# Patient Record
Sex: Female | Born: 1950 | Race: White | Hispanic: No | Smoking: Former smoker
Health system: Southern US, Community
[De-identification: ages and names within clinical notes are randomized; demographics above are authoritative.]

## PROBLEM LIST (undated history)

## (undated) ENCOUNTER — Emergency Department (HOSPITAL_BASED_OUTPATIENT_CLINIC_OR_DEPARTMENT_OTHER): Admission: EM | Discharge status: Absent without leave | Payer: BLUE CROSS/BLUE SHIELD

## (undated) DIAGNOSIS — T7840XA Allergy, unspecified, initial encounter: Secondary | ICD-10-CM

## (undated) DIAGNOSIS — M8580 Other specified disorders of bone density and structure, unspecified site: Secondary | ICD-10-CM

## (undated) DIAGNOSIS — E079 Disorder of thyroid, unspecified: Secondary | ICD-10-CM

## (undated) DIAGNOSIS — D3501 Benign neoplasm of right adrenal gland: Secondary | ICD-10-CM

## (undated) DIAGNOSIS — C801 Malignant (primary) neoplasm, unspecified: Secondary | ICD-10-CM

## (undated) DIAGNOSIS — L649 Androgenic alopecia, unspecified: Secondary | ICD-10-CM

## (undated) DIAGNOSIS — J45909 Unspecified asthma, uncomplicated: Secondary | ICD-10-CM

## (undated) HISTORY — PX: TONSILLECTOMY: SUR1361

## (undated) HISTORY — PX: WISDOM TOOTH EXTRACTION: SHX21

## (undated) HISTORY — DX: Malignant (primary) neoplasm, unspecified: C80.1

## (undated) HISTORY — PX: ABDOMINAL HYSTERECTOMY: SHX81

## (undated) HISTORY — PX: RHINOPLASTY: SUR1284

## (undated) HISTORY — DX: Benign neoplasm of right adrenal gland: D35.01

## (undated) HISTORY — DX: Unspecified asthma, uncomplicated: J45.909

## (undated) HISTORY — DX: Androgenic alopecia, unspecified: L64.9

## (undated) HISTORY — DX: Other specified disorders of bone density and structure, unspecified site: M85.80

## (undated) HISTORY — PX: TUBAL LIGATION: SHX77

## (undated) HISTORY — DX: Allergy, unspecified, initial encounter: T78.40XA

## (undated) HISTORY — DX: Disorder of thyroid, unspecified: E07.9

---

## 1999-11-19 ENCOUNTER — Ambulatory Visit (HOSPITAL_COMMUNITY): Admission: RE | Admit: 1999-11-19 | Discharge: 1999-11-19 | Payer: Self-pay | Admitting: Family Medicine

## 1999-11-19 ENCOUNTER — Encounter: Payer: Self-pay | Admitting: Family Medicine

## 1999-11-25 ENCOUNTER — Encounter: Payer: Self-pay | Admitting: Family Medicine

## 1999-11-25 ENCOUNTER — Ambulatory Visit (HOSPITAL_COMMUNITY): Admission: RE | Admit: 1999-11-25 | Discharge: 1999-11-25 | Payer: Self-pay | Admitting: Family Medicine

## 2000-09-22 ENCOUNTER — Other Ambulatory Visit: Admission: RE | Admit: 2000-09-22 | Discharge: 2000-09-22 | Payer: Self-pay | Admitting: Obstetrics and Gynecology

## 2000-10-11 ENCOUNTER — Encounter: Payer: Self-pay | Admitting: Obstetrics and Gynecology

## 2000-10-11 ENCOUNTER — Encounter: Admission: RE | Admit: 2000-10-11 | Discharge: 2000-10-11 | Payer: Self-pay | Admitting: Obstetrics and Gynecology

## 2001-06-13 ENCOUNTER — Encounter: Admission: RE | Admit: 2001-06-13 | Discharge: 2001-06-13 | Payer: Self-pay | Admitting: Obstetrics and Gynecology

## 2001-06-13 ENCOUNTER — Encounter: Payer: Self-pay | Admitting: Obstetrics and Gynecology

## 2002-12-04 ENCOUNTER — Encounter: Admission: RE | Admit: 2002-12-04 | Discharge: 2002-12-04 | Payer: Self-pay | Admitting: Obstetrics and Gynecology

## 2002-12-04 ENCOUNTER — Encounter: Payer: Self-pay | Admitting: Obstetrics and Gynecology

## 2002-12-06 ENCOUNTER — Encounter: Payer: Self-pay | Admitting: Obstetrics and Gynecology

## 2002-12-06 ENCOUNTER — Encounter: Admission: RE | Admit: 2002-12-06 | Discharge: 2002-12-06 | Payer: Self-pay | Admitting: Obstetrics and Gynecology

## 2004-07-02 ENCOUNTER — Encounter: Admission: RE | Admit: 2004-07-02 | Discharge: 2004-07-02 | Payer: Self-pay | Admitting: Obstetrics and Gynecology

## 2004-07-06 ENCOUNTER — Ambulatory Visit (HOSPITAL_COMMUNITY): Admission: RE | Admit: 2004-07-06 | Discharge: 2004-07-06 | Payer: Self-pay | Admitting: Gastroenterology

## 2004-12-10 ENCOUNTER — Encounter: Admission: RE | Admit: 2004-12-10 | Discharge: 2004-12-10 | Payer: Self-pay | Admitting: Obstetrics and Gynecology

## 2005-08-06 ENCOUNTER — Encounter: Admission: RE | Admit: 2005-08-06 | Discharge: 2005-08-06 | Payer: Self-pay | Admitting: Obstetrics and Gynecology

## 2005-08-26 ENCOUNTER — Encounter: Admission: RE | Admit: 2005-08-26 | Discharge: 2005-08-26 | Payer: Self-pay | Admitting: Obstetrics and Gynecology

## 2006-02-04 ENCOUNTER — Encounter: Admission: RE | Admit: 2006-02-04 | Discharge: 2006-02-04 | Payer: Self-pay | Admitting: Obstetrics and Gynecology

## 2006-08-25 ENCOUNTER — Encounter: Admission: RE | Admit: 2006-08-25 | Discharge: 2006-08-25 | Payer: Self-pay | Admitting: Obstetrics and Gynecology

## 2006-09-12 ENCOUNTER — Other Ambulatory Visit: Admission: RE | Admit: 2006-09-12 | Discharge: 2006-09-12 | Payer: Self-pay | Admitting: Obstetrics & Gynecology

## 2007-09-25 ENCOUNTER — Encounter: Admission: RE | Admit: 2007-09-25 | Discharge: 2007-09-25 | Payer: Self-pay | Admitting: Obstetrics and Gynecology

## 2008-10-16 ENCOUNTER — Encounter: Admission: RE | Admit: 2008-10-16 | Discharge: 2008-10-16 | Payer: Self-pay | Admitting: Obstetrics and Gynecology

## 2008-10-28 ENCOUNTER — Encounter: Admission: RE | Admit: 2008-10-28 | Discharge: 2008-10-28 | Payer: Self-pay | Admitting: Obstetrics and Gynecology

## 2010-03-24 ENCOUNTER — Encounter: Admission: RE | Admit: 2010-03-24 | Discharge: 2010-03-24 | Payer: Self-pay | Admitting: Obstetrics and Gynecology

## 2010-10-04 ENCOUNTER — Encounter: Payer: Self-pay | Admitting: Obstetrics and Gynecology

## 2011-01-29 NOTE — Op Note (Signed)
Suzanne Bishop, Suzanne Bishop              ACCOUNT NO.:  [id number]   MEDICAL RECORD NO.:  [id number]          PATIENT TYPE:  AMB   LOCATION:  ENDO                         FACILITY:  [facility]   PHYSICIAN:  Anselmo Rod, M.D.  DATE OF BIRTH:  21-Jan-1951   DATE OF PROCEDURE:  07/06/2004  DATE OF DISCHARGE:                                 OPERATIVE REPORT   PROCEDURE:  Screening colonoscopy.   ENDOSCOPIST:  Anselmo Rod, M.D.   INSTRUMENT USED:  Olympus video colonoscope.   INDICATION FOR PROCEDURE:  A 60 year old white female with a history of  occasional constipation undergoing a screening colonoscopy to rule out  colonic polyps, masses, etc.   PREPROCEDURE PREPARATION:  Informed consent was procured from the patient  and the patient fasted for 8 hours prior to the procedure and prepped with a  bottle of magnesium citrate and a gallon of GoLYTELY the night prior to the  procedure.   PREPROCEDURE PHYSICAL:  The patient had stable vital signs, neck supple,  chest clear to auscultation, S1/S2 regular, abdomen soft with normal bowel  sounds.   DESCRIPTION OF PROCEDURE:  The patient was placed in the left lateral  decubitus position and sedated with 70 mg of Demerol and 7 mg of Versed in  slow incremental doses.  Once the patient was adequately sedated and  maintained on low-flow oxygen and continuous cardiac monitoring, the Olympus  video colonoscope was advanced from the rectum to the cecum and the  appendiceal orifice and ileocecal valve were clearly visualized and  photographed.  No masses, polyps, erosions, ulcerations, or diverticula were  seen.  Small internal hemorrhoids were appreciated on retroflexion in the  rectum.   IMPRESSION:  1.  Normal colonoscopy up to the cecum except for small internal      hemorrhoids.  2.  No masses, polyps, or diverticula seen.   RECOMMENDATIONS:  1.  Continue high fiber diet with good fluid intake.  2.  Repeat colonoscopy in the next 10  years unless the patient develops any      abdominal symptoms in the interim.  3.  Outpatient followup to be arranged in the future.       JNM/MEDQ  D:  07/06/2004  T:  07/06/2004  Job:  [id number]   cc:   Laqueta Linden, M.D.  [address], [address]  [place]  [place] [address]  Fax: [phone]   Meredith Staggers, M.D.  [address], [address]  [place]  [place] [address]  Fax: [phone]

## 2011-03-01 ENCOUNTER — Other Ambulatory Visit: Payer: Self-pay | Admitting: Obstetrics and Gynecology

## 2011-03-01 DIAGNOSIS — Z1231 Encounter for screening mammogram for malignant neoplasm of breast: Secondary | ICD-10-CM

## 2011-03-29 ENCOUNTER — Ambulatory Visit
Admission: RE | Admit: 2011-03-29 | Discharge: 2011-03-29 | Disposition: A | Discharge status: Home / Self Care | Payer: BC Managed Care – PPO | Source: Ambulatory Visit | Attending: Obstetrics and Gynecology | Admitting: Obstetrics and Gynecology

## 2011-03-29 DIAGNOSIS — Z1231 Encounter for screening mammogram for malignant neoplasm of breast: Secondary | ICD-10-CM

## 2012-01-31 DIAGNOSIS — L649 Androgenic alopecia, unspecified: Secondary | ICD-10-CM | POA: Insufficient documentation

## 2012-01-31 DIAGNOSIS — L719 Rosacea, unspecified: Secondary | ICD-10-CM | POA: Insufficient documentation

## 2012-03-30 ENCOUNTER — Other Ambulatory Visit: Payer: Self-pay | Admitting: Obstetrics and Gynecology

## 2012-03-30 DIAGNOSIS — Z1231 Encounter for screening mammogram for malignant neoplasm of breast: Secondary | ICD-10-CM

## 2012-05-17 ENCOUNTER — Ambulatory Visit
Admission: RE | Admit: 2012-05-17 | Discharge: 2012-05-17 | Disposition: A | Discharge status: Home / Self Care | Payer: BC Managed Care – PPO | Source: Ambulatory Visit | Attending: Obstetrics and Gynecology | Admitting: Obstetrics and Gynecology

## 2012-05-17 DIAGNOSIS — Z1231 Encounter for screening mammogram for malignant neoplasm of breast: Secondary | ICD-10-CM

## 2012-07-09 ENCOUNTER — Ambulatory Visit: Payer: BC Managed Care – PPO

## 2012-07-09 ENCOUNTER — Ambulatory Visit (INDEPENDENT_AMBULATORY_CARE_PROVIDER_SITE_OTHER): Payer: BC Managed Care – PPO | Admitting: Family Medicine

## 2012-07-09 VITALS — BP 125/71 | HR 69 | Temp 98.2°F | Resp 16 | Ht 63.5 in | Wt 131.2 lb

## 2012-07-09 DIAGNOSIS — J40 Bronchitis, not specified as acute or chronic: Secondary | ICD-10-CM

## 2012-07-09 DIAGNOSIS — R05 Cough: Secondary | ICD-10-CM

## 2012-07-09 DIAGNOSIS — R062 Wheezing: Secondary | ICD-10-CM

## 2012-07-09 DIAGNOSIS — R059 Cough, unspecified: Secondary | ICD-10-CM

## 2012-07-09 MED ORDER — ALBUTEROL SULFATE (2.5 MG/3ML) 0.083% IN NEBU: 2.5000 mg | INHALATION_SOLUTION | Freq: Once | RESPIRATORY_TRACT | Status: DC

## 2012-07-09 MED ORDER — HYDROCODONE-HOMATROPINE 5-1.5 MG/5ML PO SYRP: 5.0000 mL | ORAL_SOLUTION | Freq: Three times a day (TID) | ORAL | Status: DC | PRN

## 2012-07-09 MED ORDER — ALBUTEROL SULFATE HFA 108 (90 BASE) MCG/ACT IN AERS: 2.0000 | INHALATION_SPRAY | RESPIRATORY_TRACT | Status: DC | PRN

## 2012-07-09 MED ORDER — AZITHROMYCIN 250 MG PO TABS: ORAL_TABLET | ORAL | Status: DC

## 2012-07-09 MED ORDER — IPRATROPIUM BROMIDE 0.02 % IN SOLN: 0.5000 mg | Freq: Once | RESPIRATORY_TRACT | Status: DC

## 2012-07-09 NOTE — Progress Notes (Signed)
  Subjective:    Patient ID: Suzanne Bishop, female    DOB: 01-17-1951, 61 y.o.   MRN: [id number]  HPI 61 year old female presents with 1 week history of productive cough, rhinorrhea, and postnasal drainage.  States cough has been productive of clear mucous.  Denies fever, chills, nausea, vomiting, abdominal pain, headache, or sinus pain.  Is a current everyday smoker.  She has been taking Mucinex which helps some.  No known sick contacts.      Review of Systems  Constitutional: Negative for fever and chills.  HENT: Positive for congestion, rhinorrhea and postnasal drip. Negative for sore throat and neck pain.   Respiratory: Positive for cough and chest tightness. Negative for wheezing.   Gastrointestinal: Negative for vomiting, abdominal pain and diarrhea.  Neurological: Negative for headaches.  All other systems reviewed and are negative.       Objective:   Physical Exam  Constitutional: She is oriented to person, place, and time. She appears well-developed and well-nourished.  HENT:  Head: Normocephalic and atraumatic.  Right Ear: External ear normal.  Left Ear: External ear normal.  Eyes: Conjunctivae normal are normal.  Neck: Normal range of motion. Neck supple.  Cardiovascular: Normal rate, regular rhythm and normal heart sounds.   Pulmonary/Chest: Effort normal. She has wheezes (diffuse).       Wheezing improved s/p albuterol/atrovent nebulizer Patient reported improvement of symptoms after breathing treatment.   Lymphadenopathy:    She has no cervical adenopathy.  Neurological: She is alert and oriented to person, place, and time.  Psychiatric: She has a normal mood and affect. Her behavior is normal. Judgment and thought content normal.         [facility] reading (PRIMARY) by  Dr. Neva Seat as scoliosis with increased markings throughout.    Assessment & Plan:   1. Cough  DG Chest 2 View, HYDROcodone-homatropine (HYCODAN) 5-1.5 MG/5ML syrup  2. Wheezing  DG Chest 2  View, albuterol (PROVENTIL) (2.5 MG/3ML) 0.083% nebulizer solution 2.5 mg, ipratropium (ATROVENT) nebulizer solution 0.5 mg, albuterol (PROVENTIL HFA;VENTOLIN HFA) 108 (90 BASE) MCG/ACT inhaler  3. Bronchitis  azithromycin (ZITHROMAX) 250 MG tablet  Start Zpack Increase fluids and rest Hycodan as needed for cough Albuterol prn wheezing, SOB Follow up if symptoms worsen or fail to improve.

## 2012-07-10 NOTE — Progress Notes (Signed)
Xray read and patient discussed with Rhoderick Moody, PA-C. Agree with assessment and plan of care per her note. approx 30 degrees scoliosis and increased RLL markings without discrete infiltrate. Discussed antitussive if not wheezing/dyspneic.

## 2013-05-08 ENCOUNTER — Other Ambulatory Visit: Payer: Self-pay

## 2013-05-08 DIAGNOSIS — Z1231 Encounter for screening mammogram for malignant neoplasm of breast: Secondary | ICD-10-CM

## 2013-05-22 ENCOUNTER — Encounter: Payer: Self-pay | Admitting: Obstetrics & Gynecology

## 2013-05-22 ENCOUNTER — Ambulatory Visit (INDEPENDENT_AMBULATORY_CARE_PROVIDER_SITE_OTHER): Payer: BC Managed Care – PPO | Admitting: Obstetrics & Gynecology

## 2013-05-22 ENCOUNTER — Ambulatory Visit: Payer: BC Managed Care – PPO | Admitting: Obstetrics and Gynecology

## 2013-05-22 VITALS — BP 136/70 | HR 64 | Resp 16 | Ht 63.25 in | Wt 129.6 lb

## 2013-05-22 DIAGNOSIS — E2839 Other primary ovarian failure: Secondary | ICD-10-CM

## 2013-05-22 DIAGNOSIS — Z0000 Encounter for general adult medical examination without abnormal findings: Secondary | ICD-10-CM

## 2013-05-22 DIAGNOSIS — Z01419 Encounter for gynecological examination (general) (routine) without abnormal findings: Secondary | ICD-10-CM

## 2013-05-22 LAB — POCT URINALYSIS DIPSTICK
Bilirubin, UA: NEGATIVE
Glucose, UA: NEGATIVE
Leukocytes, UA: NEGATIVE
Nitrite, UA: NEGATIVE
Urobilinogen, UA: NEGATIVE

## 2013-05-22 MED ORDER — ESTERIFIED ESTROGENS 0.625 MG PO TABS: 0.6250 mg | ORAL_TABLET | Freq: Every day | ORAL | Status: DC

## 2013-05-22 NOTE — Progress Notes (Addendum)
62 y.o. G1P1 DivorcedCaucasianF here for annual exam.  No vaginal bleeding.  H/O TAH/BSO/Appy due to endometriosis.  Dr. Cliffton Asters checks labs and thyroid.  Has follow-up scheduled with her for a repeat test.  Doing well on HRT.  Complains of frequency during the day.  Not an issue at night. Not interested in medications.  We discussed options but she declines for now.  Patient's last menstrual period was 08/13/1994.          Sexually active: no  The current method of family planning is status post hysterectomy.    Exercising: yes  lifting boxes of files daily Smoker:  yes  Health Maintenance: Pap:  09/22/00 WNL History of abnormal Pap:  no MMG:  05/17/12 normal-scheduled 06/27/13 Colonoscopy:  10/05 repeat in 10 years, Dr. Loreta Ave BMD:   1/09, will repeat this year TDaP:  2013 Screening Labs: PCP, Hb today: PCP, Urine today: RBC-trace   reports that she has been smoking.  She has never used smokeless tobacco. She reports that she drinks about 1.5 ounces of alcohol per week. She reports that she does not use illicit drugs.  Past Medical History  Diagnosis Date  . Allergy   . Asthma   . Thyroid disease     hypothyroid  . Osteopenia   . Androgenic alopecia     Past Surgical History  Procedure Laterality Date  . Cesarean section    . Abdominal hysterectomy    . Tubal ligation    . Tonsillectomy    . Rhinoplasty    . Wisdom tooth extraction      Current Outpatient Prescriptions  Medication Sig Dispense Refill  . albuterol (PROVENTIL HFA;VENTOLIN HFA) 108 (90 BASE) MCG/ACT inhaler Inhale 2 puffs into the lungs every 4 (four) hours as needed for wheezing (cough, shortness of breath or wheezing.).  1 Inhaler  1  . Ascorbic Acid (VITAMIN C) 100 MG tablet Take 100 mg by mouth daily.      . calcium carbonate 200 MG capsule Take 250 mg by mouth 2 (two) times daily with a meal.      . cholecalciferol (VITAMIN D) 1000 UNITS tablet Take 1,000 Units by mouth daily.      Marland Kitchen esterified estrogens  (MENEST) 0.625 MG tablet Take 0.625 mg by mouth daily.      . fish oil-omega-3 fatty acids 1000 MG capsule Take 2 g by mouth daily.      Marland Kitchen levothyroxine (SYNTHROID, LEVOTHROID) 88 MCG tablet Take 88 mcg by mouth daily.      . metroNIDAZOLE (METROGEL) 0.75 % gel Apply topically 2 (two) times daily.      . Multiple Vitamin (MULTIVITAMIN) tablet Take 1 tablet by mouth daily.       No current facility-administered medications for this visit.    Family History  Problem Relation Age of Onset  . Heart disease Mother   . Osteoporosis Mother   . Cancer Father     prostate  . Heart disease Maternal Grandmother   . Heart disease Maternal Grandfather   . Cancer Paternal Grandmother     ? unknown type    ROS:  Pertinent items are noted in HPI.  Otherwise, a comprehensive ROS was negative.  Exam:   BP 136/70  Pulse 64  Resp 16  Ht 5' 3.25" (1.607 m)  Wt 129 lb 9.6 oz (58.786 kg)  BMI 22.76 kg/m2  LMP 08/13/1994  Weight change: +1lb  Height: 5' 3.25" (160.7 cm)  Ht Readings from Last 3  Encounters:  05/22/13 5' 3.25" (1.607 m)  07/09/12 5' 3.5" (1.613 m)    General appearance: alert, cooperative and appears stated age Head: Normocephalic, without obvious abnormality, atraumatic Neck: no adenopathy, supple, symmetrical, trachea midline and thyroid normal to inspection and palpation Lungs: clear to auscultation bilaterally Breasts: normal appearance, no masses or tenderness Heart: regular rate and rhythm Abdomen: soft, non-tender; bowel sounds normal; no masses,  no organomegaly Extremities: extremities normal, atraumatic, no cyanosis or edema Skin: Skin color, texture, turgor normal. No rashes or lesions Lymph nodes: Cervical, supraclavicular, and axillary nodes normal. No abnormal inguinal nodes palpated Neurologic: Grossly normal   Pelvic: External genitalia:  no lesions              Urethra:  normal appearing urethra with no masses, tenderness or lesions              Bartholins  and Skenes: normal                 Vagina: normal appearing vagina with normal color and discharge, no lesions              Cervix: absent              Pap taken: no Bimanual Exam:  Uterus:  uterus absent              Adnexa: no mass, fullness, tenderness               Rectovaginal: Confirms               Anus:  normal sphincter tone, no lesions  A:  Well Woman with normal exam H/O TAH/BSO/Appendectomy 12/95 On HRT Hypothyroidism.  Has follow-up with Dr. Cliffton Asters OAB  P:   Mammogram yearly Plan BMD pap smear not indicated Menest 0.625mg  daily #30/13 Offered medical treatment for OAB.  Declines for now but she knows there are options. return annually or prn  An After Visit Summary was printed and given to the patient.

## 2013-05-22 NOTE — Patient Instructions (Addendum)

## 2013-05-28 NOTE — Addendum Note (Signed)
Addended by: Jerene Bears on: 05/28/2013 01:12 PM   Modules accepted: Orders

## 2013-05-29 ENCOUNTER — Telehealth: Payer: Self-pay | Admitting: Obstetrics & Gynecology

## 2013-05-29 NOTE — Telephone Encounter (Signed)
Patient called back she had no idea who called her she just told them that she would call back. She doesn't know what the call was about either.

## 2013-05-31 NOTE — Telephone Encounter (Signed)
Spoke with patient-scheduled her BMD on the same day as her MMG 06/27/13.

## 2013-06-27 ENCOUNTER — Ambulatory Visit: Payer: BC Managed Care – PPO

## 2013-06-27 ENCOUNTER — Ambulatory Visit
Admission: RE | Admit: 2013-06-27 | Discharge: 2013-06-27 | Disposition: A | Discharge status: Home / Self Care | Payer: BC Managed Care – PPO | Source: Ambulatory Visit | Attending: Obstetrics & Gynecology | Admitting: Obstetrics & Gynecology

## 2013-06-27 ENCOUNTER — Ambulatory Visit
Admission: RE | Admit: 2013-06-27 | Discharge: 2013-06-27 | Disposition: A | Discharge status: Home / Self Care | Payer: BC Managed Care – PPO | Source: Ambulatory Visit

## 2013-06-27 DIAGNOSIS — E2839 Other primary ovarian failure: Secondary | ICD-10-CM

## 2013-06-27 DIAGNOSIS — Z1231 Encounter for screening mammogram for malignant neoplasm of breast: Secondary | ICD-10-CM

## 2014-05-21 ENCOUNTER — Other Ambulatory Visit: Payer: Self-pay

## 2014-05-21 DIAGNOSIS — Z1231 Encounter for screening mammogram for malignant neoplasm of breast: Secondary | ICD-10-CM

## 2014-06-04 ENCOUNTER — Ambulatory Visit (INDEPENDENT_AMBULATORY_CARE_PROVIDER_SITE_OTHER): Payer: BC Managed Care – PPO | Admitting: Obstetrics & Gynecology

## 2014-06-04 ENCOUNTER — Encounter: Payer: Self-pay | Admitting: Obstetrics & Gynecology

## 2014-06-04 VITALS — BP 144/90 | HR 72 | Ht 63.25 in | Wt 134.0 lb

## 2014-06-04 DIAGNOSIS — N3281 Overactive bladder: Secondary | ICD-10-CM

## 2014-06-04 DIAGNOSIS — F17200 Nicotine dependence, unspecified, uncomplicated: Secondary | ICD-10-CM

## 2014-06-04 DIAGNOSIS — Z0000 Encounter for general adult medical examination without abnormal findings: Secondary | ICD-10-CM

## 2014-06-04 DIAGNOSIS — Z87891 Personal history of nicotine dependence: Secondary | ICD-10-CM | POA: Insufficient documentation

## 2014-06-04 DIAGNOSIS — N318 Other neuromuscular dysfunction of bladder: Secondary | ICD-10-CM

## 2014-06-04 DIAGNOSIS — Z01419 Encounter for gynecological examination (general) (routine) without abnormal findings: Secondary | ICD-10-CM

## 2014-06-04 LAB — POCT URINALYSIS DIPSTICK
BILIRUBIN UA: NEGATIVE
GLUCOSE UA: NEGATIVE
KETONES UA: NEGATIVE
Leukocytes, UA: NEGATIVE
NITRITE UA: NEGATIVE
Protein, UA: NEGATIVE
Urobilinogen, UA: NEGATIVE
pH, UA: 6

## 2014-06-04 MED ORDER — ESTERIFIED ESTROGENS 0.625 MG PO TABS: 0.6250 mg | ORAL_TABLET | Freq: Every day | ORAL | Status: DC

## 2014-06-04 MED ORDER — MIRABEGRON ER 50 MG PO TB24: 50.0000 mg | ORAL_TABLET | Freq: Every day | ORAL | Status: DC

## 2014-06-04 NOTE — Progress Notes (Signed)
63 y.o. G1P1 DivorcedCaucasianF here for annual exam.  No vaginal bleeding.  PCP:  Harlan Stains.  Has appt scheduled in November.  Last TSH was normal.  Still having OAB symptoms.  Would like to try medication again this morning.   Patient's last menstrual period was 08/13/1994.          Sexually active: No.  The current method of family planning is status post hysterectomy.    Exercising: Yes.    picking up boxes Smoker:  Daily smoker and will chew nicotine gum  Health Maintenance: Pap:  09/22/00 WNL History of abnormal Pap:  no MMG:  06/27/13-normal Colonoscopy:  2005-has appointment for consult to schedule next colonoscopy--Oct with Dr. Collene Mares BMD:   06/27/13 TDaP:  2013 Screening Labs: PCP, Hb today: PCP, Urine today: RBC-trace, PH-6.0   reports that she has been smoking.  She uses smokeless tobacco. She reports that she drinks about 1.5 - 2.5 ounces of alcohol per week. She reports that she does not use illicit drugs.  Past Medical History  Diagnosis Date  . Allergy   . Asthma   . Thyroid disease     hypothyroid  . Osteopenia   . Androgenic alopecia     Past Surgical History  Procedure Laterality Date  . Cesarean section    . Abdominal hysterectomy    . Tubal ligation    . Tonsillectomy    . Rhinoplasty    . Wisdom tooth extraction      Current Outpatient Prescriptions  Medication Sig Dispense Refill  . Ascorbic Acid (VITAMIN C) 100 MG tablet Take 100 mg by mouth daily.      . calcium carbonate 200 MG capsule Take 250 mg by mouth 2 (two) times daily with a meal.      . esterified estrogens (MENEST) 0.625 MG tablet Take 1 tablet (0.625 mg total) by mouth daily.  30 tablet  13  . FINASTERIDE PO Take by mouth.      . fish oil-omega-3 fatty acids 1000 MG capsule Take 2 g by mouth daily.      Marland Kitchen levothyroxine (SYNTHROID, LEVOTHROID) 88 MCG tablet Take 88 mcg by mouth daily.      . metroNIDAZOLE (METROGEL) 0.75 % gel Apply topically 2 (two) times daily.      . Multiple  Vitamin (MULTIVITAMIN) tablet Take 1 tablet by mouth daily.      Marland Kitchen VITAMIN D, CHOLECALCIFEROL, PO Take 400 Int'l Units by mouth daily.       No current facility-administered medications for this visit.    Family History  Problem Relation Age of Onset  . Heart disease Mother   . Osteoporosis Mother   . Cancer Father     prostate  . Heart disease Maternal Grandmother   . Heart disease Maternal Grandfather   . Cancer Paternal Grandmother     ? unknown type    ROS:  Pertinent items are noted in HPI.  Otherwise, a comprehensive ROS was negative.  Exam:   BP 144/90  Pulse 72  Ht 5' 3.25" (1.607 m)  Wt 134 lb (60.782 kg)  BMI 23.54 kg/m2  LMP 08/13/1994  Weight change: +5#   Height: 5' 3.25" (160.7 cm)  Ht Readings from Last 3 Encounters:  06/04/14 5' 3.25" (1.607 m)  05/22/13 5' 3.25" (1.607 m)  07/09/12 5' 3.5" (1.613 m)    General appearance: alert, cooperative and appears stated age Head: Normocephalic, without obvious abnormality, atraumatic Neck: no adenopathy, supple, symmetrical, trachea midline and  thyroid normal to inspection and palpation Lungs: clear to auscultation bilaterally Breasts: normal appearance, no masses or tenderness Heart: regular rate and rhythm Abdomen: soft, non-tender; bowel sounds normal; no masses,  no organomegaly Extremities: extremities normal, atraumatic, no cyanosis or edema Skin: Skin color, texture, turgor normal. No rashes or lesions Lymph nodes: Cervical, supraclavicular, and axillary nodes normal. No abnormal inguinal nodes palpated Neurologic: Grossly normal   Pelvic: External genitalia:  no lesions              Urethra:  normal appearing urethra with no masses, tenderness or lesions              Bartholins and Skenes: normal                 Vagina: normal appearing vagina with normal color and discharge, no lesions              Cervix: absent              Pap taken: No. Bimanual Exam:  Uterus:  uterus absent               Adnexa: no mass, fullness, tenderness               Rectovaginal: Confirms               Anus:  normal sphincter tone, no lesions  A:  Well Woman with normal exam  H/O TAH/BSO/Appendectomy 12/95  On HRT  Hypothyroidism. Has follow-up with Dr. Dema Severin Elevated BP today.  Always normal in the past.  OAB  Osteopenia  P: Mammogram yearly  Plan BMD next year 2016  pap smear not indicated  Menest 0.625mg  daily #30/13  Pt going to get BP cuff and check at home and let Dr. Dema Severin see values when she goes for apt in November. Trial of Myrbetriq 50mg  daily.  return annually or prn  An After Visit Summary was printed and given to the patient.

## 2014-06-05 ENCOUNTER — Telehealth: Payer: Self-pay | Admitting: Obstetrics & Gynecology

## 2014-06-05 NOTE — Telephone Encounter (Signed)
S/W Pt. She wanted to know was having a trace of RBC on her UA results something to worry about. Stated to pt I would have a discussion with Dr. Sabra Heck and give her a call with an answer. She voiced understanding and agreed. She states she is not having any symptoms but wanted to make sure this is nothing to worry about.   Routed to provider for review

## 2014-06-05 NOTE — Telephone Encounter (Signed)
Patient is calling regarding her urine results from yesterday.

## 2014-06-07 NOTE — Telephone Encounter (Signed)
Spoke with patient. Advised of message as seen below from [place]. Patient is agreeable and verbalizes understanding. Patient states "I feel a lot better after talking to you and hearing what Dr.Miller had to say. I do not think I need to come back in since she thinks it will be okay." Advised patient if she changes her mind to give [place] a call and we will set her up a nurse visit appointment to drop off a urine sample. Patient is agreeable.  Routing to provider for final review. Patient agreeable to disposition. Will close encounter

## 2014-06-07 NOTE — Telephone Encounter (Signed)
It is only trace RBCs on u/a and is incredibly common in post menopausal women.  If I see more than trace, I always do further evaluation but not for trace RBCs unless a woman is having symptoms.  If this worries her, I am happy to do further testing with microscopic urinalysis.  She can come back and leave a sample if that is what she wants but I do not think it is necessary.  Message came from [place] but sending to triage to call.

## 2014-07-01 ENCOUNTER — Ambulatory Visit
Admission: RE | Admit: 2014-07-01 | Discharge: 2014-07-01 | Disposition: A | Discharge status: Home / Self Care | Payer: BC Managed Care – PPO | Source: Ambulatory Visit

## 2014-07-01 DIAGNOSIS — Z1231 Encounter for screening mammogram for malignant neoplasm of breast: Secondary | ICD-10-CM

## 2014-07-15 ENCOUNTER — Encounter: Payer: Self-pay | Admitting: Obstetrics & Gynecology

## 2014-08-09 ENCOUNTER — Encounter: Payer: Self-pay | Admitting: Interventional Cardiology

## 2014-08-09 ENCOUNTER — Ambulatory Visit (INDEPENDENT_AMBULATORY_CARE_PROVIDER_SITE_OTHER): Payer: BC Managed Care – PPO | Admitting: Interventional Cardiology

## 2014-08-09 VITALS — BP 126/80 | HR 68 | Ht 63.0 in | Wt 130.0 lb

## 2014-08-09 DIAGNOSIS — M79602 Pain in left arm: Secondary | ICD-10-CM

## 2014-08-09 DIAGNOSIS — N3281 Overactive bladder: Secondary | ICD-10-CM

## 2014-08-09 DIAGNOSIS — E785 Hyperlipidemia, unspecified: Secondary | ICD-10-CM

## 2014-08-09 NOTE — Patient Instructions (Addendum)
Your physician has requested that you have a stress echocardiogram. For further information please visit [email or web address]. Please follow instruction sheet as given.  Your physician recommends that you schedule a follow-up appointment AS NEEDED with Dr. Irish Lack.

## 2014-08-09 NOTE — Progress Notes (Signed)
Patient ID: Suzanne Bishop, female   DOB: 01/19/1951, 63 y.o.   MRN: [id number]     Patient ID: Suzanne Bishop MRN: [id number] DOB/AGE: 12-05-1950 63 y.o.   Referring Physician Dr. Dema Severin   Reason for Consultation  Left arm pain  HPI: 63 y/o who has a family h/o early CAD.  Multiple people have had coronary revascularization. She is here for evaluation of left arm pain.  Lifting boxes is her most strenuous activity. No chest pain or arm pain with that.  SHe does have some back pain with that.  Most days, she has some left arm pain, just above the elbow.  Episodes last a few minutes.  No alleviating factors.  NO inciting factors.  No problems with walking stairs at work.    Last year, she had episodic palpitations lasting a few minutes.  It would be accompanied by a headache.  SHe would be shaky and feel thte palpitations.  No syncope, lightheadedness.  She had some CP with inspiration.  Sx have improved.  SHe has stopped smoking since that time.  Her BP was increasing at the time and then she was worried about a stroke, so she stopped smoking in 9/15. She uses nicorette gum.    Current Outpatient Prescriptions  Medication Sig Dispense Refill  . Ascorbic Acid (VITAMIN C) 100 MG tablet Take 100 mg by mouth daily.    . calcium carbonate 200 MG capsule Take 250 mg by mouth 2 (two) times daily with a meal.    . ciclopirox (PENLAC) 8 % solution   0  . esterified estrogens (MENEST) 0.625 MG tablet Take 1 tablet (0.625 mg total) by mouth daily. 30 tablet 13  . finasteride (PROSCAR) 5 MG tablet   1  . fish oil-omega-3 fatty acids 1000 MG capsule Take 2 g by mouth daily.    Marland Kitchen GAVILYTE-G 236 G solution See admin instructions.  0  . levothyroxine (SYNTHROID, LEVOTHROID) 88 MCG tablet Take 88 mcg by mouth daily.    . metroNIDAZOLE (METROGEL) 0.75 % gel Apply topically 2 (two) times daily.    . Multiple Vitamin (MULTIVITAMIN) tablet Take 1 tablet by mouth daily.    . TURMERIC PO Take by mouth  daily.    Marland Kitchen VITAMIN D, CHOLECALCIFEROL, PO Take 400 Int'l Units by mouth daily.     No current facility-administered medications for this visit.   Past Medical History  Diagnosis Date  . Allergy   . Asthma   . Thyroid disease     hypothyroid  . Osteopenia   . Androgenic alopecia     Family History  Problem Relation Age of Onset  . Heart disease Mother   . Osteoporosis Mother   . Cancer Father     prostate  . Heart disease Maternal Grandmother   . Heart disease Maternal Grandfather   . Cancer Paternal Grandmother     ? unknown type    History   Social History  . Marital Status: Divorced    Spouse Name: N/A    Number of Children: N/A  . Years of Education: N/A   Occupational History  . Not on file.   Social History Main Topics  . Smoking status: Current Every Day Smoker -- 8 years  . Smokeless tobacco: Current User     Comment: 5-10 cigarettes daily and chews nicotine gum   . Alcohol Use: 1.5 - 2.5 oz/week    3-5 drink(s) per week  . Drug Use: No  . Sexual  Activity: No     Comment: TAH/BSO   Other Topics Concern  . Not on file   Social History Narrative    Past Surgical History  Procedure Laterality Date  . Cesarean section    . Abdominal hysterectomy    . Tubal ligation    . Tonsillectomy    . Rhinoplasty    . Wisdom tooth extraction        (Not in a hospital admission)  Review of systems complete and found to be negative unless listed above .  No nausea, vomiting.  No fever chills, No focal weakness,  No palpitations.  Physical Exam: Filed Vitals:   08/09/14 0827  BP: 126/80  Pulse: 68    Weight: 130 lb (58.968 kg)  Physical exam:  /AT EOMI No JVD, No carotid bruit RRR S1S2  No wheezing Soft. NT, nondistended No edema. No focal motor or sensory deficits Normal affect  Labs:  No results found for: WBC, HGB, HCT, MCV, PLT No results for input(s): NA, K, CL, CO2, BUN, CREATININE, CALCIUM, PROT, BILITOT, ALKPHOS, ALT, AST, GLUCOSE in  the last 168 hours.  Invalid input(s): LABALBU No results found for: CKTOTAL, CKMB, CKMBINDEX, TROPONINI No results found for: CHOL No results found for: HDL No results found for: LDLCALC No results found for: TRIG No results found for: CHOLHDL No results found for: LDLDIRECT    Radiology: EKG: Normal  ASSESSMENT AND PLAN:   Atypical chest pain: We'll plan for stress echocardiogram to evaluate for ischemia.  I congratulated her on stopping smoking. She should continue to abstain. Further follow-up will be based on the results of the stress echo.  Hyperlipidemia: She had been tried on statins several years ago and had myalgia with Lipitor and Zocor caused abnormal liver test.  She is trying dietary modifications to help lower her LDL. Current LDL 141. Getting below 130 would be adequate for her. Signed:   Mina Marble, MD, [facility] 08/09/2014, 8:48 AM

## 2014-08-20 ENCOUNTER — Ambulatory Visit (HOSPITAL_COMMUNITY): Payer: BC Managed Care – PPO | Attending: Cardiology

## 2014-08-20 DIAGNOSIS — M79602 Pain in left arm: Secondary | ICD-10-CM

## 2014-08-20 NOTE — Progress Notes (Signed)
Stress echo performed.

## 2014-09-14 ENCOUNTER — Ambulatory Visit (INDEPENDENT_AMBULATORY_CARE_PROVIDER_SITE_OTHER): Payer: BLUE CROSS/BLUE SHIELD | Admitting: Family Medicine

## 2014-09-14 ENCOUNTER — Ambulatory Visit (INDEPENDENT_AMBULATORY_CARE_PROVIDER_SITE_OTHER): Payer: BLUE CROSS/BLUE SHIELD

## 2014-09-14 VITALS — BP 152/82 | HR 66 | Temp 98.5°F | Resp 16 | Ht 63.5 in | Wt 126.6 lb

## 2014-09-14 DIAGNOSIS — Z720 Tobacco use: Secondary | ICD-10-CM

## 2014-09-14 DIAGNOSIS — R071 Chest pain on breathing: Secondary | ICD-10-CM

## 2014-09-14 NOTE — Patient Instructions (Signed)
Recently, a new recommendation has been made regarding screening for lung cancer using annual "low dose" CT scanning.  This service is recommended for people who are 32- 62 years old, who currently smoke or quit in the last 15 years, and who smoked at least a pack per day for 30 years or more.    Some patients who are at least 62 years old and who smoked a pack per day for 20 years, or were exposed to second hand smoke may also qualify for screening.    In [place] this service is available at [facility], [phone]. The exam costs about $300 but may be covered by insurance.    I think that your pain is likely in your chest wall and not due to your lungs. Let me know if you do not feel better soon You can certainly try some OTC medications as needed for your pain

## 2014-09-14 NOTE — Progress Notes (Signed)
[facility] [address], [place] [address] [phone]  Date:  09/14/2014   Name:  Suzanne Bishop   DOB:  [birth year]   MRN:  [id number]  PCP:  Vidal Schwalbe, MD    Chief Complaint: Fatigue and when take a deep breath has flank pain, and chest pain   History of Present Illness:  Suzanne Bishop is a 64 y.o. very pleasant female patient who presents with the following:  She is here today with pain upon taking a deep breath or moving her trunk for about one week.  She notes difficulty if she tries to roll over or change position while laying in bed or on the couch. It feels better if she presses on her sides and splints then with her hands when she changes position.    About a year ago she seemed to have some episodes of tachycardia- saw her PCP who send her to cardiology.  She just had a stress test recently and it looked ok- 08/20/2014 Study Conclusions  - Stress ECG conclusions: There were no stress arrhythmias or conduction abnormalities. The stress ECG was negative for ischemia. - Staged echo: There was no echocardiographic evidence for stress-induced ischemia.  Bruce protocol. Stress echocardiography. Birthdate: Patient birthdate: 08/21/1951. Age: Patient is 64 yr old. Sex: Gender: female.  BMI: 23.1 kg/m^2. Blood pressure:   126/80 Patient status: Outpatient. Study date: Study date: 08/20/2014. Study time: 03:25 PM.  She has noted a couple of chills recently, but no fever. No cough.   She quit smoking a few months ago but is now chewing a lot of nicorette gum She does not feel SOB   She is also "tired all the time" for the last 6 months or so.   At work recently she had to pick up and move a whole lot of boxes.  It did not seem to bother her at the time but she wonders if this could be why she is having this pain now  She is a former smoker and admits to some worry about her lung cancer risk. She has about a 20 pack year  history.    BP Readings from Last 3 Encounters:  09/14/14 160/82  08/09/14 126/80  06/04/14 144/90    Patient Active Problem List   Diagnosis Date Noted  . Left arm pain 08/09/2014  . Smoker 06/04/2014  . OAB (overactive bladder) 06/04/2014    Past Medical History  Diagnosis Date  . Allergy   . Asthma   . Thyroid disease     hypothyroid  . Osteopenia   . Androgenic alopecia     Past Surgical History  Procedure Laterality Date  . Cesarean section    . Abdominal hysterectomy    . Tubal ligation    . Tonsillectomy    . Rhinoplasty    . Wisdom tooth extraction      History  Substance Use Topics  . Smoking status: Former Smoker -- 8 years    Quit date: 06/09/2014  . Smokeless tobacco: Current User     Comment: 5-10 cigarettes daily and chews nicotine gum   . Alcohol Use: 1.5 - 2.5 oz/week    3-5 Not specified per week    Family History  Problem Relation Age of Onset  . Heart disease Mother   . Osteoporosis Mother   . Cancer Father     prostate  . Heart disease Maternal Grandmother   . Heart disease Maternal Grandfather   .  Cancer Paternal Grandmother     ? unknown type    Allergies  Allergen Reactions  . Sulfa Antibiotics Hives    Medication list has been reviewed and updated.  Current Outpatient Prescriptions on File Prior to Visit  Medication Sig Dispense Refill  . Ascorbic Acid (VITAMIN C) 100 MG tablet Take 100 mg by mouth daily.    . calcium carbonate 200 MG capsule Take 250 mg by mouth 2 (two) times daily with a meal.    . esterified estrogens (MENEST) 0.625 MG tablet Take 1 tablet (0.625 mg total) by mouth daily. 30 tablet 13  . finasteride (PROSCAR) 5 MG tablet   1  . fish oil-omega-3 fatty acids 1000 MG capsule Take 2 g by mouth daily.    Marland Kitchen levothyroxine (SYNTHROID, LEVOTHROID) 88 MCG tablet Take 88 mcg by mouth daily.    . metroNIDAZOLE (METROGEL) 0.75 % gel Apply topically 2 (two) times daily.    . Multiple Vitamin (MULTIVITAMIN) tablet  Take 1 tablet by mouth daily.    . TURMERIC PO Take by mouth daily.    Marland Kitchen VITAMIN D, CHOLECALCIFEROL, PO Take 400 Int'l Units by mouth daily.     No current facility-administered medications on file prior to visit.    Review of Systems:  As per HPI- otherwise negative. No fever, cough or other systemic sx  Physical Examination: Filed Vitals:   09/14/14 1412  BP: 160/82  Pulse: 66  Temp: 98.5 F (36.9 C)  Resp: 16   Filed Vitals:   09/14/14 1412  Height: 5' 3.5" (1.613 m)  Weight: 126 lb 9.6 oz (57.425 kg)   Body mass index is 22.07 kg/(m^2). Ideal Body Weight: Weight in (lb) to have BMI = 25: 143.1  GEN: WDWN, NAD, Non-toxic, A & O x 3, slim build, looks well HEENT: Atraumatic, Normocephalic. Neck supple. No masses, No LAD.  Bilateral TM wnl, oropharynx normal.  PEERL,EOMI.   Ears and Nose: No external deformity. CV: RRR, No M/G/R. No JVD. No thrill. No extra heart sounds. Easily reproducible CP- she is sore to pressure on her right ribs under the breast especially.   PULM: CTA B, no wheezes, crackles, rhonchi. No retractions. No resp. distress. No accessory muscle use. ABD: S, NT, ND, +BS. No rebound. No HSM.  No RUQ tenderness, negative exam  EXTR: No c/c/e NEURO Normal gait.  PSYCH: Normally interactive. Conversant. Not depressed or anxious appearing.  Calm demeanor.  Able to reproduce pain by pressing on her right chest wall  [facility] reading (PRIMARY) by  Dr. Lorelei Pont, CXR:  scoliosis, OW negative  CHEST 2 VIEW  COMPARISON: July 09, 2012.  FINDINGS: The heart size and mediastinal contours are within normal limits. Both lungs are clear. No pneumothorax or pleural effusion is noted. S-shaped scoliosis of thoracic spine is noted and stable.  IMPRESSION: No acute cardiopulmonary abnormality seen.  Offered a repeat EKG today but she declines- she just had a stress test and "this does not feel like my heart"  Assessment and Plan: Chest pain on breathing -  Plan: DG Chest 2 View  Tobacco abuse  Encouraged her to recheck her BP in a week or so as it is a Bevins high today.   She feels reassured and plans to use ibuprofen or tylenol as needed for her chest pain.   She will recheck if sx not resolved soon Discussed low dose CT scanning for lung cancer screening. She may qualify for this- she will think about it.  Signed Lamar Blinks, MD

## 2015-05-30 ENCOUNTER — Other Ambulatory Visit: Payer: Self-pay

## 2015-05-30 DIAGNOSIS — Z1231 Encounter for screening mammogram for malignant neoplasm of breast: Secondary | ICD-10-CM

## 2015-06-16 ENCOUNTER — Other Ambulatory Visit: Payer: Self-pay | Admitting: *Deleted

## 2015-06-16 MED ORDER — ESTERIFIED ESTROGENS 0.625 MG PO TABS: 0.6250 mg | ORAL_TABLET | Freq: Every day | ORAL | Status: DC

## 2015-06-16 NOTE — Telephone Encounter (Signed)
Medication refill request: Menest Last AEX:  06/04/14 SM Next AEX: 07/18/15 SM Last MMG (if hormonal medication request): 07/02/14 BIRADS1:neg. Has appt scheduled 07/08/15  Refill authorized: 06/04/14 #30tabs/ 13 refills. Today #30/1R?  Routed to [employer]

## 2015-06-16 NOTE — Telephone Encounter (Signed)
Patient notified- Rx sent to pharmacy. 

## 2015-06-16 NOTE — Telephone Encounter (Signed)
Patient calling to check on the status of this refill request.  

## 2015-06-17 ENCOUNTER — Other Ambulatory Visit: Payer: Self-pay | Admitting: Gastroenterology

## 2015-06-17 DIAGNOSIS — R109 Unspecified abdominal pain: Secondary | ICD-10-CM

## 2015-06-26 ENCOUNTER — Other Ambulatory Visit: Payer: Self-pay

## 2015-06-26 ENCOUNTER — Ambulatory Visit
Admission: RE | Admit: 2015-06-26 | Discharge: 2015-06-26 | Disposition: A | Discharge status: Home / Self Care | Payer: BLUE CROSS/BLUE SHIELD | Source: Ambulatory Visit | Attending: Gastroenterology | Admitting: Gastroenterology

## 2015-06-26 DIAGNOSIS — R109 Unspecified abdominal pain: Secondary | ICD-10-CM

## 2015-06-26 MED ORDER — IOPAMIDOL (ISOVUE-300) INJECTION 61%
100.0000 mL | Freq: Once | INTRAVENOUS | Status: AC | PRN
Start: 2015-06-26 — End: 2015-06-26
  Administered 2015-06-26: 100 mL via INTRAVENOUS

## 2015-07-01 ENCOUNTER — Telehealth: Payer: Self-pay | Admitting: Oncology

## 2015-07-01 NOTE — Telephone Encounter (Signed)
New patient appt-s/w patient and gave np appt for 10/19 @ 10:30 w/Dr. Benay Spice

## 2015-07-02 ENCOUNTER — Telehealth: Payer: Self-pay | Admitting: Oncology

## 2015-07-02 ENCOUNTER — Encounter: Payer: Self-pay | Admitting: Oncology

## 2015-07-02 ENCOUNTER — Encounter: Payer: Self-pay | Admitting: *Deleted

## 2015-07-02 ENCOUNTER — Ambulatory Visit (HOSPITAL_BASED_OUTPATIENT_CLINIC_OR_DEPARTMENT_OTHER): Payer: BLUE CROSS/BLUE SHIELD | Admitting: Oncology

## 2015-07-02 ENCOUNTER — Ambulatory Visit (HOSPITAL_BASED_OUTPATIENT_CLINIC_OR_DEPARTMENT_OTHER): Payer: BLUE CROSS/BLUE SHIELD

## 2015-07-02 VITALS — BP 127/55 | HR 67 | Temp 97.9°F | Resp 18 | Ht 63.5 in | Wt 125.3 lb

## 2015-07-02 DIAGNOSIS — R1900 Intra-abdominal and pelvic swelling, mass and lump, unspecified site: Secondary | ICD-10-CM | POA: Diagnosis not present

## 2015-07-02 DIAGNOSIS — R110 Nausea: Secondary | ICD-10-CM | POA: Diagnosis not present

## 2015-07-02 DIAGNOSIS — R109 Unspecified abdominal pain: Secondary | ICD-10-CM | POA: Diagnosis not present

## 2015-07-02 LAB — CBC WITH DIFFERENTIAL/PLATELET
BASO%: 0.9 % (ref 0.0–2.0)
Basophils Absolute: 0 10*3/uL (ref 0.0–0.1)
EOS%: 1.8 % (ref 0.0–7.0)
Eosinophils Absolute: 0.1 10*3/uL (ref 0.0–0.5)
HEMATOCRIT: 40.4 % (ref 34.8–46.6)
HEMOGLOBIN: 13.3 g/dL (ref 11.6–15.9)
LYMPH#: 1.5 10*3/uL (ref 0.9–3.3)
LYMPH%: 31 % (ref 14.0–49.7)
MCH: 31 pg (ref 25.1–34.0)
MCHC: 32.9 g/dL (ref 31.5–36.0)
MCV: 94.3 fL (ref 79.5–101.0)
MONO#: 0.4 10*3/uL (ref 0.1–0.9)
MONO%: 8.4 % (ref 0.0–14.0)
NEUT%: 57.9 % (ref 38.4–76.8)
NEUTROS ABS: 2.7 10*3/uL (ref 1.5–6.5)
Platelets: 261 10*3/uL (ref 145–400)
RBC: 4.29 10*6/uL (ref 3.70–5.45)
RDW: 14 % (ref 11.2–14.5)
WBC: 4.7 10*3/uL (ref 3.9–10.3)

## 2015-07-02 LAB — COMPREHENSIVE METABOLIC PANEL (CC13)
ALBUMIN: 4.3 g/dL (ref 3.5–5.0)
ALT: 40 U/L (ref 0–55)
AST: 19 U/L (ref 5–34)
Alkaline Phosphatase: 98 U/L (ref 40–150)
Anion Gap: 10 mEq/L (ref 3–11)
BILIRUBIN TOTAL: 0.43 mg/dL (ref 0.20–1.20)
BUN: 14.4 mg/dL (ref 7.0–26.0)
CALCIUM: 10.3 mg/dL (ref 8.4–10.4)
CHLORIDE: 102 meq/L (ref 98–109)
CO2: 27 mEq/L (ref 22–29)
CREATININE: 0.9 mg/dL (ref 0.6–1.1)
EGFR: 67 mL/min/{1.73_m2} — ABNORMAL LOW (ref >90)
Glucose: 145 mg/dl — ABNORMAL HIGH (ref 70–140)
Potassium: 4.2 mEq/L (ref 3.5–5.1)
Sodium: 139 mEq/L (ref 136–145)
TOTAL PROTEIN: 7.4 g/dL (ref 6.4–8.3)

## 2015-07-02 LAB — LACTATE DEHYDROGENASE (CC13): LDH: 175 U/L (ref 125–245)

## 2015-07-02 MED ORDER — ONDANSETRON HCL 8 MG PO TABS: 8.0000 mg | ORAL_TABLET | Freq: Three times a day (TID) | ORAL | Status: DC | PRN

## 2015-07-02 NOTE — Telephone Encounter (Signed)
apponitments made and avs printed for patient

## 2015-07-02 NOTE — Progress Notes (Signed)
Oncology Nurse Navigator Documentation  Oncology Nurse Navigator Flowsheets 07/02/2015  Referral date to RadOnc/MedOnc 06/30/2015  Navigator Encounter Type Initial MedOnc  Patient Visit Type Medonc  Treatment Phase Abnormal Scans--diagnostic work up  [employer] for [employer] work and biopsy;Symptom Management-nausea  Interventions Education Method  Education Method Verbal;Written;Knowles resouces  Time Spent with Patient 60  Met with patient and her close friend, Carlynn Herald during new patient visit. Explained the role of the GI Nurse Navigator and provided New Patient Packet with information on: 1. Support groups 2. Advanced Directives 3. Fall Safety Plan Answered questions, reviewed current treatment plan using TEACH back and provided emotional support. Provided copy of current treatment plan. Escorted her to scheduler and lab for testing. Explained MyChart-she declines at this time.  Merceda Elks, RN, BSN GI Oncology [place]

## 2015-07-02 NOTE — Progress Notes (Signed)
[place] New Patient Consult   Referring MD: Harlan Stains, [place], [place] [address]   Suzanne Bishop 64 y.o.  [birth year]    Reason for Referral: Retroperitoneal mass   HPI: Suzanne Bishop reports constant nausea beginning in July of this year. She was treated with antiacid therapy and the nausea is not improved. Phenergan caused drowsiness. The nausea improved when she chews ginger.  She has pain at the right greater than left lower back. She was referred to Suzanne Bishop. She complained of occasional stool and bloating. She had undergone a colonoscopy in December 2015 that revealed scattered diverticula and a 6 mm polyp in the mid descending colon. The colon polyp returned as a hyperplastic polyp. She was taken to an upper endoscopy 05/26/2015. The esophagus and stomach appeared normal. Moderate erythematous mucosa was found in the duodenum. The area was biopsied to rule out sprue. The biopsy returned as a peptic type duodenopathy.  Suzanne Bishop referred her for a CT the abdomen and pelvis on 06/26/2015. 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 The mass appears separate from the adrenal gland and liver. No other masses or lymphadenopathy.  Past Medical History  Diagnosis Date  .  seasonal allergies    . Asthma   . Thyroid disease     hypothyroid  . Osteopenia   . Androgenic alopecia     .   G1 P1   .   Rosacea   .   Endometriosis  Past Surgical History  Procedure Laterality Date  . Cesarean section    . Abdominal hysterectomy/BSO    . Tubal ligation    . Tonsillectomy    . Rhinoplasty    . Wisdom tooth extraction      Medications: Reviewed  Allergies:  Allergies  Allergen Reactions  . Sulfa Antibiotics Hives    Family history: Her father had prostate cancer. A maternal uncle had "lip "cancer. Her paternal grandmother  may have had "stomach cancer ". No other family history of cancer.  Social History:   She lives in [place]. She works in an office occupation. She quit smoking cigarettes and drinking alcohol one year ago. She was transfused in 1978 secondary to menorrhagia. No risk factor for HIV or hepatitis.  ROS:   Positives include: Mild anorexia, 5 pound weight loss, constant nausea-relieved with Ginger, constipation and light-colored stools, urinary frequency since undergoing a hysterectomy, pain in the right greater than left lower back-worse after meals, daily headache, frequent belching, few mild nose bleeds, headache with tachycardia and left arm pain a year ago, rash over the trunk and arms  A complete ROS was otherwise negative.  Physical Exam:  Blood pressure 127/55, pulse 67, temperature 97.9 F (36.6 C), temperature source Oral, resp. rate 18, height 5' 3.5" (1.613 m), weight 125 lb 4.8 oz (56.836 kg), last menstrual period 08/13/1994, SpO2 96 %.  HEENT: Oropharynx without visible mass, neck without mass Lungs: Clear bilaterally Cardiac: Regular rate and rhythm Abdomen: No hepatosplenomegaly, no mass, mild tenderness in the mid and upper abdomen bilaterally  Vascular: No leg edema Lymph nodes: No cervical, supra-clavicular, axillary, or inguinal nodes Neurologic: Alert and origin, the motor exam appears intact in the upper and lower extremities Skin: Fine T and rash over the abdomen and arms Musculoskeletal: No spine tenderness    Imaging:  As per history  of present illness, CT images from 06/26/2015 reviewed with Suzanne Bishop   Assessment/Plan:   1. Cystic/solid retroperitoneal mass   2. Nausea  3.   Abdomen and back pain  4.   Chronic constipation   Disposition:   Suzanne Bishop is referred for evaluation of a right ridge peritoneal mass in the setting of abdomen/back pain and nausea. I reviewed the CT images with Suzanne Bishop. Her images were reviewed at the GI tumor  conference this morning. There is a broad differential diagnosis including lymphoma, sarcoma, a metastatic tumor, pheochromocytoma, or a benign process. There is no other apparent primary tumor site on review of her history, exam, and imaging to date.  She will be referred for a chest CT. We obtained urine and serum studies to look for evidence of a pheochromocytoma. I referred her to interventional radiology for a biopsy of the retroperitoneal mass.  Suzanne Bishop is symptomatic with pain and nausea, presumably related to the mass. She declined a prescription for a narcotic. We prescribed Zofran for nausea. She will try MiraLAX for constipation.  Suzanne Bishop will return for an office visit after the diagnostic biopsy. She will contact us in the interim as needed.  Bettina Warn 07/02/2015, 12:03 PM

## 2015-07-03 ENCOUNTER — Telehealth: Payer: Self-pay | Admitting: *Deleted

## 2015-07-03 ENCOUNTER — Ambulatory Visit: Payer: BLUE CROSS/BLUE SHIELD | Admitting: Oncology

## 2015-07-03 DIAGNOSIS — R1900 Intra-abdominal and pelvic swelling, mass and lump, unspecified site: Secondary | ICD-10-CM

## 2015-07-03 MED ORDER — TRAMADOL HCL 50 MG PO TABS: 50.0000 mg | ORAL_TABLET | Freq: Four times a day (QID) | ORAL | Status: DC | PRN

## 2015-07-03 NOTE — Telephone Encounter (Signed)
Oncology Nurse Navigator Documentation  Oncology Nurse Navigator Flowsheets 07/03/2015  Referral date to RadOnc/MedOnc -  Navigator Encounter Type Telephone  Patient Visit Type -  Treatment Phase Staging  Barriers/Navigation Needs Education  Education Pain management & urine collection  Interventions Spoke w/MD and called in Tramadol to pharmacy; checked w/lab regarding urine collection. If she voids more than bottle will hold, OK to use a container at home that is clean to put excess (will not be returned).  Education Method Verbal  Support Groups/Services -  Time Spent with Patient 15  Confirmed her that 2nd opinion is very reasonable and we want her to be confident in her provider and treatment plan. Her images have already been sent to her cousin at [facility], who is a Psychologist, sport and exercise. Made her aware that [facility] can see all records from our office in the [facility] system.

## 2015-07-03 NOTE — Telephone Encounter (Addendum)
Call from pt reporting she is seeking a second opinion at [facility]. Pt declined biopsy appointment for now, she has a cousin who is a Psychologist, sport and exercise at [facility] and is waiting to hear back from him with a recommendation on how to proceed. Recommended she get biopsy scheduled in the meantime, she declined. Pt will call to schedule after hearing from [facility]. She is collecting the 24 hour urine as planned.

## 2015-07-04 ENCOUNTER — Telehealth: Payer: Self-pay | Admitting: *Deleted

## 2015-07-04 NOTE — Telephone Encounter (Signed)
TC from patient requesting that her appt with Dr. Benay Spice scheduled for 07/14/15, be cancelled as she is getting 2nd opinion @ [facility]

## 2015-07-04 NOTE — Telephone Encounter (Signed)
  Oncology Nurse Navigator Documentation    Navigator Encounter Type: Telephone (07/04/15 1555)  Suzanne Bishop called to report she has decided to have all her care at [facility] under the direction of her cousin, who is a Psychologist, sport and exercise there. Please cancel her appointments. She feels Dr. Benay Spice is a great doctor and the staff are wonderful. She would feel better going to [facility] Wants her records faxed to [phone]  Att: Medical Records. She did return her urine today. The physican's name is Dr. Bailey Mech at phone [phone]

## 2015-07-07 ENCOUNTER — Other Ambulatory Visit: Payer: Self-pay | Admitting: Obstetrics & Gynecology

## 2015-07-07 MED ORDER — ESTERIFIED ESTROGENS 0.625 MG PO TABS: 0.6250 mg | ORAL_TABLET | Freq: Every day | ORAL | Status: DC

## 2015-07-07 NOTE — Telephone Encounter (Signed)
Patient calling requesting refills on Menest. Pharmacy on file is correct. She said, "I have an abdominal mass I have to get taken care of before I reschedule my cancelled AEX. I will need as many refills as possible to get me though this."

## 2015-07-07 NOTE — Telephone Encounter (Signed)
RF for three months given.  Reviewed EPIC notes.  Will monitor to see definitive diagnosis for pt.

## 2015-07-07 NOTE — Telephone Encounter (Signed)
Medication refill request: Menest  Last AEX:  06/04/14 SM Next AEX: none Last MMG (if hormonal medication request): 07/02/14 BIRADS1:neg Refill authorized: 06/16/15 #30/1R. Today please advise.

## 2015-07-08 ENCOUNTER — Ambulatory Visit: Payer: Self-pay

## 2015-07-14 ENCOUNTER — Ambulatory Visit: Payer: BLUE CROSS/BLUE SHIELD | Admitting: Oncology

## 2015-07-15 ENCOUNTER — Encounter: Payer: Self-pay | Admitting: *Deleted

## 2015-07-15 LAB — METANEPHRINES, PLASMA
METANEPHRINE FREE: 841 pg/mL — AB (ref <=57)
Normetanephrine, Free: 1421 pg/mL — ABNORMAL HIGH (ref <=148)
TOTAL METANEPHRINES-PLASMA: 2262 pg/mL — AB (ref <=205)

## 2015-07-15 NOTE — Progress Notes (Signed)
Faxed new patient office note and labs, including plasma metanephrine to Dr. Bailey Mech [phone] at [facility]

## 2015-07-16 LAB — METANEPHRINES, URINE, 24 HOUR
METANEPHRINES UR: 2859 ug/(24.h) — AB (ref 90–315)
Metaneph Total, Ur: 6000 mcg/24 h — ABNORMAL HIGH (ref 224–832)
Normetanephrine, 24H Ur: 3141 mcg/24 h — ABNORMAL HIGH (ref 122–676)
VOLUME, URINE-METAN: 4900 mL

## 2015-07-16 LAB — CATECHOLAMINES, FRACTIONATED, URINE, 24 HOUR
CREATININE, URINE MG/DAY-CATEUR: 1.13 g/(24.h) (ref 0.63–2.50)
Calculated Total (E+NE): 83 mcg/24 h (ref 26–121)
DOPAMINE, 24 HR URINE: 44 ug/(24.h) — AB (ref 52–480)
EPINEPHRINE, 24 HR URINE: 14 ug/(24.h) (ref 2–24)
Norepinephrine, 24 hr Ur: 69 mcg/24 h (ref 15–100)
Total Volume - CF 24Hr U: 4900 mL

## 2015-07-18 ENCOUNTER — Ambulatory Visit: Payer: BC Managed Care – PPO | Admitting: Obstetrics & Gynecology

## 2015-07-31 DIAGNOSIS — E063 Autoimmune thyroiditis: Secondary | ICD-10-CM | POA: Insufficient documentation

## 2015-07-31 DIAGNOSIS — D3500 Benign neoplasm of unspecified adrenal gland: Secondary | ICD-10-CM | POA: Insufficient documentation

## 2015-08-14 ENCOUNTER — Telehealth: Payer: Self-pay | Admitting: Obstetrics & Gynecology

## 2015-08-19 ENCOUNTER — Ambulatory Visit (INDEPENDENT_AMBULATORY_CARE_PROVIDER_SITE_OTHER): Payer: BLUE CROSS/BLUE SHIELD | Admitting: Obstetrics & Gynecology

## 2015-08-19 ENCOUNTER — Encounter: Payer: Self-pay | Admitting: Obstetrics & Gynecology

## 2015-08-19 VITALS — BP 122/62 | HR 66 | Resp 16 | Ht 63.25 in | Wt 131.0 lb

## 2015-08-19 DIAGNOSIS — Z01419 Encounter for gynecological examination (general) (routine) without abnormal findings: Secondary | ICD-10-CM | POA: Diagnosis not present

## 2015-08-19 DIAGNOSIS — Z0000 Encounter for general adult medical examination without abnormal findings: Secondary | ICD-10-CM | POA: Diagnosis not present

## 2015-08-19 DIAGNOSIS — Z124 Encounter for screening for malignant neoplasm of cervix: Secondary | ICD-10-CM | POA: Diagnosis not present

## 2015-08-19 LAB — POCT URINALYSIS DIPSTICK
BILIRUBIN UA: NEGATIVE
Glucose, UA: NEGATIVE
KETONES UA: NEGATIVE
LEUKOCYTES UA: NEGATIVE
NITRITE UA: NEGATIVE
PH UA: 7
Protein, UA: NEGATIVE
RBC UA: NEGATIVE
Urobilinogen, UA: NEGATIVE

## 2015-08-19 MED ORDER — ESTERIFIED ESTROGENS 0.625 MG PO TABS: 0.6250 mg | ORAL_TABLET | Freq: Every day | ORAL | Status: DC

## 2015-08-19 NOTE — Progress Notes (Signed)
64 y.o. G1P1 DivorcedCaucasianF here for annual exam.  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  Orene Desanctis, MD, is her cousin.  He is head of surgical services at [facility].  Surgery scheduled for 09/19/15 for ex lap.  Pt now understands she now has a lot of symptoms that are attributed to this.  This was diagnosed with 7cm mass on a CT done 06/26/15 with Dr. Collene Mares.    Denies vaginal bleeding.    Had a flu shot.    Patient's last menstrual period was 08/13/1994.          Sexually active: Yes.    The current method of family planning is status post hysterectomy.    Exercising: Yes.    Weight bearing Smoker: Former  Health Maintenance: Pap:  09/2000 Normal  History of abnormal Pap:  no MMG:  07/02/14 BIRADS1:neg.  She's going to plan doing this after her surgery and recovery.   Colonoscopy:  08/2014 Polyp - repeat 5 - 10 years  BMD:  06/27/13 Osteoporosis  TDaP:  2013  Screening Labs: PCP, Hb today: PCP, Urine today: Negative   reports that she quit smoking about 14 months ago. She has quit using smokeless tobacco. She reports that she does not drink alcohol or use illicit drugs.  Past Medical History  Diagnosis Date  . Allergy   . Asthma   . Thyroid disease     hypothyroid  . Osteopenia   . Androgenic alopecia   . Pheochromocytoma of right adrenal gland     2016    Past Surgical History  Procedure Laterality Date  . Cesarean section    . Abdominal hysterectomy    . Tubal ligation    . Tonsillectomy    . Rhinoplasty    . Wisdom tooth extraction      Current Outpatient Prescriptions  Medication Sig Dispense Refill  . Ascorbic Acid (VITAMIN C) 100 MG tablet Take 100 mg by mouth daily.    . calcium carbonate (OS-CAL - DOSED IN MG OF ELEMENTAL CALCIUM) 1250 (500 CA) MG tablet Take by mouth.    . doxazosin (CARDURA) 2 MG tablet Take 4 mg by mouth daily.    Marland Kitchen esterified estrogens (MENEST) 0.625 MG tablet Take 1 tablet (0.625 mg total) by mouth daily. 30 tablet 2  . finasteride  (PROSCAR) 5 MG tablet   1  . metroNIDAZOLE (METROGEL) 0.75 % gel Apply topically 2 (two) times daily.    . Minoxidil (ROGAINE MENS) 5 % FOAM Apply topically.    . Multiple Vitamin (MULTIVITAMIN) tablet Take 1 tablet by mouth daily.    . ondansetron (ZOFRAN) 8 MG tablet Take 1 tablet (8 mg total) by mouth every 8 (eight) hours as needed for nausea or vomiting. 30 tablet 2  . traMADol (ULTRAM) 50 MG tablet Take 1 tablet (50 mg total) by mouth every 6 (six) hours as needed for moderate pain. 60 tablet 0  . VITAMIN D, CHOLECALCIFEROL, PO Take 400 Int'l Units by mouth daily.    . metyrosine (DEMSER) 250 MG capsule Take 250 mg by mouth daily.    Marland Kitchen SYNTHROID 112 MCG tablet Take 1 tablet by mouth daily.  0   No current facility-administered medications for this visit.    Family History  Problem Relation Age of Onset  . Heart disease Mother   . Osteoporosis Mother   . Cancer Father     prostate  . Heart disease Maternal Grandmother   . Heart disease Maternal Grandfather   .  Cancer Paternal Grandmother     ? unknown type    ROS:  Pertinent items are noted in HPI.  Otherwise, a comprehensive ROS was negative.  Exam:   BP 122/62 mmHg  Pulse 66  Resp 16  Ht 5' 3.25" (1.607 m)  Wt 131 lb (59.421 kg)  BMI 23.01 kg/m2  LMP 08/13/1994  Weight change: -3#   Height: 5' 3.25" (160.7 cm)  Ht Readings from Last 3 Encounters:  08/19/15 5' 3.25" (1.607 m)  07/02/15 5' 3.5" (1.613 m)  09/14/14 5' 3.5" (1.613 m)    General appearance: alert, cooperative and appears stated age Head: Normocephalic, without obvious abnormality, atraumatic Neck: no adenopathy, supple, symmetrical, trachea midline and thyroid normal to inspection and palpation Lungs: clear to auscultation bilaterally Breasts: normal appearance, no masses or tenderness Heart: regular rate and rhythm Abdomen: soft, non-tender; bowel sounds normal; no masses,  no organomegaly Extremities: extremities normal, atraumatic, no cyanosis or  edema Skin: Skin color, texture, turgor normal. No rashes or lesions Lymph nodes: Cervical, supraclavicular, and axillary nodes normal. No abnormal inguinal nodes palpated Neurologic: Grossly normal   Pelvic: External genitalia:  no lesions              Urethra:  normal appearing urethra with no masses, tenderness or lesions              Bartholins and Skenes: normal                 Vagina: normal appearing vagina with normal color and discharge, no lesions              Cervix: absent              Pap taken: Yes.   Bimanual Exam:  Uterus:  uterus absent              Adnexa: no mass, fullness, tenderness               Rectovaginal: Confirms               Anus:  normal sphincter tone, no lesions  Chaperone was present for exam.  A:  Well Woman with normal exam  H/O TAH/BSO/Appendectomy 12/95  On HRT.  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  OAB  Osteopenia  P: Mammogram yearly.  Pt is going to do this after surgery and recovery Hold plans for BMD until next year pap smear obtained Menest 0.625mg  daily #30/13  Labs with Dr. Dema Severin and Dr. Carlis Abbott at [facility] return annually or prn

## 2015-08-20 ENCOUNTER — Telehealth: Payer: Self-pay | Admitting: Obstetrics & Gynecology

## 2015-08-20 LAB — IPS PAP TEST WITH REFLEX TO HPV

## 2015-08-20 NOTE — Telephone Encounter (Signed)
Patient calling wanting to know instructions in regards to taking estrogen before surgery. Best # to reach patient [phone]

## 2015-08-21 NOTE — Telephone Encounter (Signed)
Patient states she sent her surgeon a message about stopping estrogen prior to surgery. She states that his answer was vague and she is unsure of when to stop Menest prior to her procedure 09/19/15 and would like Dr. Ammie Ferrier instruction. She states she is aware to restart when she is up and moving after surgery but would like to have a clarification from Dr. Sabra Heck as well.   Advised will send message to Dr. Sabra Heck and return call with response.

## 2015-08-21 NOTE — Telephone Encounter (Signed)
Patient is calling to check on status of call. Advised we are awaiting further recommendations from [place] and will return call as soon as possible. Patient is agreeable.

## 2015-08-21 NOTE — Telephone Encounter (Signed)
I would stop this at least 7 days before the surgery.  This will allow some of the estrogen to be out of her system and should decrease her clot risk.  Thanks.

## 2015-08-21 NOTE — Telephone Encounter (Signed)
Spoke with patient. Advised of message as seen below from Dr.Miller. Patient is agreeable and verbalizes understanding.  Routing to provider for final review. Patient agreeable to disposition. Will close encounter   

## 2015-11-18 DIAGNOSIS — IMO0002 Reserved for concepts with insufficient information to code with codable children: Secondary | ICD-10-CM | POA: Insufficient documentation

## 2016-03-05 ENCOUNTER — Ambulatory Visit (INDEPENDENT_AMBULATORY_CARE_PROVIDER_SITE_OTHER): Payer: BLUE CROSS/BLUE SHIELD | Admitting: Physician Assistant

## 2016-03-05 VITALS — BP 128/78 | HR 76 | Temp 98.2°F | Resp 17 | Ht 64.0 in | Wt 128.0 lb

## 2016-03-05 DIAGNOSIS — H00016 Hordeolum externum left eye, unspecified eyelid: Secondary | ICD-10-CM

## 2016-03-05 DIAGNOSIS — H04123 Dry eye syndrome of bilateral lacrimal glands: Secondary | ICD-10-CM

## 2016-03-05 DIAGNOSIS — D447 Neoplasm of uncertain behavior of aortic body and other paraganglia: Secondary | ICD-10-CM | POA: Insufficient documentation

## 2016-03-05 NOTE — Patient Instructions (Addendum)
Warm compresses and wash with Laural Benes and Johnson baby shampoo no tear -  Nighttime ointment for at night and just a simple artifical tear for during the day  Stye A stye is a bump on your eyelid caused by a bacterial infection. A stye can form inside the eyelid (internal stye) or outside the eyelid (external stye). An internal stye may be caused by an infected oil-producing gland inside your eyelid. An external stye may be caused by an infection at the base of your eyelash (hair follicle). Styes are very common. Anyone can get them at any age. They usually occur in just one eye, but you may have more than one in either eye.  CAUSES  The infection is almost always caused by bacteria called Staphylococcus aureus. This is a common type of bacteria that lives on your skin. RISK FACTORS You may be at higher risk for a stye if you have had one before. You may also be at higher risk if you have:  Diabetes.  Long-term illness.  Long-term eye redness.  A skin condition called seborrhea.  High fat levels in your blood (lipids). SIGNS AND SYMPTOMS  Eyelid pain is the most common symptom of a stye. Internal styes are more painful than external styes. Other signs and symptoms may include:  Painful swelling of your eyelid.  A scratchy feeling in your eye.  Tearing and redness of your eye.  Pus draining from the stye. DIAGNOSIS  Your health care provider may be able to diagnose a stye just by examining your eye. The health care provider may also check to make sure:  You do not have a fever or other signs of a more serious infection.  The infection has not spread to other parts of your eye or areas around your eye. TREATMENT  Most styes will clear up in a few days without treatment. In some cases, you may need to use antibiotic drops or ointment to prevent infection. Your health care provider may have to drain the stye surgically if your stye is:  Large.  Causing a lot of  pain.  Interfering with your vision. This can be done using a thin blade or a needle.  HOME CARE INSTRUCTIONS   Take medicines only as directed by your health care provider.  Apply a clean, warm compress to your eye for 10 minutes, 4 times a day.  Do not wear contact lenses or eye makeup until your stye has healed.  Do not try to pop or drain the stye. SEEK MEDICAL CARE IF:  You have chills or a fever.  Your stye does not go away after several days.  Your stye affects your vision.  Your eyeball becomes swollen, red, or painful. MAKE SURE YOU:  Understand these instructions.  Will watch your condition.  Will get help right away if you are not doing well or get worse.   This information is not intended to replace advice given to you by your health care provider. Make sure you discuss any questions you have with your health care provider.   Document Released: 06/09/2005 Document Revised: 09/20/2014 Document Reviewed: 12/14/2013 Elsevier Interactive Patient Education 2016 [employer].    IF you received an x-ray today, you will receive an invoice from [facility]. Please contact [facility] at [phone] with questions or concerns regarding your invoice.   IF you received labwork today, you will receive an invoice from [employer]. Please contact [facility] at [phone] with questions or concerns regarding  your invoice.   Our billing staff will not be able to assist you with questions regarding bills from these companies.  You will be contacted with the lab results as soon as they are available. The fastest way to get your results is to activate your My Chart account. Instructions are located on the last page of this paperwork. If you have not heard from [place] regarding the results in 2 weeks, please contact this office.    We recommend that you schedule a mammogram for breast cancer screening. Typically, you do not need a  referral to do this. Please contact a local imaging center to schedule your mammogram.  [facility] - [phone]  *ask for the Radiology Department [facility] - [phone] or [phone]  [facility] - [phone] [place] [phone] [facility] - [phone]  *ask for the [facility] - [phone]  *ask for the Radiology Department [facility] - [phone]  *ask for the [place] - [phone]

## 2016-03-05 NOTE — Progress Notes (Signed)
Suzanne Bishop  MRN: [id number] DOB: 05/11/1951  Subjective:  Pt presents to clinic with bilaterally eye burning for the last month but over the last couple of days the left lower eyelid became more red and painful.  She has used warm compresses for a few days and it is getting better but it has not resolved.  She is having no vision problems.  She has been using refresh and systane and is is helping but she finds that she has am matting and thick white discharge in the corner of her eyes in the am.  Patient Active Problem List   Diagnosis Date Noted  . 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 Pheochromocytoma 07/31/2015  . Retroperitoneal mass 07/02/2015  . Left arm pain 08/09/2014  . Smoker 06/04/2014  . OAB (overactive bladder) 06/04/2014  . 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 Acne erythematosa 01/31/2012    Current Outpatient Prescriptions on File Prior to Visit  Medication Sig Dispense Refill  . Ascorbic Acid (VITAMIN C) 100 MG tablet Take 100 mg by mouth daily.    . calcium carbonate (OS-CAL - DOSED IN MG OF ELEMENTAL CALCIUM) 1250 (500 CA) MG tablet Take by mouth.    . finasteride (PROSCAR) 5 MG tablet   1  . metroNIDAZOLE (METROGEL) 0.75 % gel Apply topically 2 (two) times daily.    . Minoxidil (ROGAINE MENS) 5 % FOAM Apply topically.    . Multiple Vitamin (MULTIVITAMIN) tablet Take 1 tablet by mouth daily.    . ondansetron (ZOFRAN) 8 MG tablet Take 1 tablet (8 mg total) by mouth every 8 (eight) hours as needed for nausea or vomiting. 30 tablet 2  . SYNTHROID 112 MCG tablet Take 1 tablet by mouth daily.  0  . VITAMIN D, CHOLECALCIFEROL, PO Take 400 Int'l Units by mouth daily.     No current facility-administered medications on file prior to visit.    Allergies  Allergen Reactions  . Sulfa Antibiotics Hives    Review of Systems  Eyes: Positive for discharge (am matting) and redness (mild). Negative for photophobia,  pain, itching and visual disturbance.   Objective:  BP 128/78 mmHg  Pulse 76  Temp(Src) 98.2 F (36.8 C) (Oral)  Resp 17  Ht 5\' 4"  (1.626 m)  Wt 128 lb (58.06 kg)  BMI 21.96 kg/m2  SpO2 97%  LMP 08/13/1994  Physical Exam  Constitutional: She is oriented to person, place, and time and well-developed, well-nourished, and in no distress.  HENT:  Head: Normocephalic and atraumatic.  Right Ear: Hearing and external ear normal.  Left Ear: Hearing and external ear normal.  Eyes: Conjunctivae and EOM are normal. Pupils are equal, round, and reactive to light. Right eye exhibits discharge (white dried discharge on lashes). No foreign body present in the right eye. Left eye exhibits hordeolum (lower lid). Left eye exhibits no discharge. No foreign body present in the left eye. Right conjunctiva is not injected. Right conjunctiva has no hemorrhage. Left conjunctiva is not injected. Left conjunctiva has no hemorrhage.  Neck: Normal range of motion.  Pulmonary/Chest: Effort normal.  Neurological: She is alert and oriented to person, place, and time. Gait normal.  Skin: Skin is warm and dry.  Psychiatric: Mood, memory, affect and judgment normal.  Vitals reviewed.     Visual Acuity Screening   Right eye Left eye Both eyes  Without correction:     With correction: 20/40 20/40 20/25     Assessment and Plan :  Stye, left - warm compresses and baby shampoo   Dry eyes, bilateral - use drops in the am and nighttime ointment in the pm to help with dry eyes  Windell Hummingbird PA-C  [facility] 03/05/2016 7:17 PM

## 2016-06-15 ENCOUNTER — Ambulatory Visit
Admission: RE | Admit: 2016-06-15 | Discharge: 2016-06-15 | Disposition: A | Discharge status: Home / Self Care | Payer: BLUE CROSS/BLUE SHIELD | Source: Ambulatory Visit

## 2016-06-15 DIAGNOSIS — Z1231 Encounter for screening mammogram for malignant neoplasm of breast: Secondary | ICD-10-CM

## 2016-06-21 ENCOUNTER — Other Ambulatory Visit: Payer: Self-pay | Admitting: Family Medicine

## 2016-06-21 DIAGNOSIS — R928 Other abnormal and inconclusive findings on diagnostic imaging of breast: Secondary | ICD-10-CM

## 2016-06-22 ENCOUNTER — Telehealth: Payer: Self-pay | Admitting: Obstetrics & Gynecology

## 2016-06-22 ENCOUNTER — Other Ambulatory Visit: Payer: Self-pay | Admitting: Obstetrics & Gynecology

## 2016-06-22 DIAGNOSIS — R921 Mammographic calcification found on diagnostic imaging of breast: Secondary | ICD-10-CM

## 2016-06-22 NOTE — Telephone Encounter (Signed)
Spoke with patient. Advised patient that order has been placed for MMG and verified with Cherish at [facility]. Patient verbalizes understanding.   Routing to provider for final review. Patient is agreeable to disposition. Will close encounter.

## 2016-06-22 NOTE — Telephone Encounter (Signed)
This patient has a MMG scheduled at [place] tomorrow at 8:30am. Patient was told that her appointment will be canceled if they do not receive an order from our office. Patient was told they have contacted our office several times with no response.

## 2016-06-23 ENCOUNTER — Other Ambulatory Visit: Payer: Self-pay | Admitting: Family Medicine

## 2016-06-23 ENCOUNTER — Ambulatory Visit
Admission: RE | Admit: 2016-06-23 | Discharge: 2016-06-23 | Disposition: A | Discharge status: Home / Self Care | Payer: BLUE CROSS/BLUE SHIELD | Source: Ambulatory Visit | Attending: Obstetrics & Gynecology | Admitting: Obstetrics & Gynecology

## 2016-06-23 DIAGNOSIS — R921 Mammographic calcification found on diagnostic imaging of breast: Secondary | ICD-10-CM

## 2016-06-28 ENCOUNTER — Ambulatory Visit
Admission: RE | Admit: 2016-06-28 | Discharge: 2016-06-28 | Disposition: A | Discharge status: Home / Self Care | Payer: BLUE CROSS/BLUE SHIELD | Source: Ambulatory Visit | Attending: Family Medicine | Admitting: Family Medicine

## 2016-06-28 DIAGNOSIS — R921 Mammographic calcification found on diagnostic imaging of breast: Secondary | ICD-10-CM

## 2016-06-29 ENCOUNTER — Telehealth: Payer: Self-pay | Admitting: Obstetrics & Gynecology

## 2016-06-29 NOTE — Telephone Encounter (Signed)
Called pt today.  Answered all her questions.  Pt is considering going to [facility].  Does not want a referral yet but she will call if decides wants a referral and if we can help facilitate this.  Pt very appreciative of phone call.  Ok to close encounter.

## 2016-06-29 NOTE — Telephone Encounter (Signed)
Patient states she has been going to Suzanne Bishop for another issue and will have them follow up with her concerning her diagnosis of breast cancer. She would still like to speak with Dr. Sabra Heck.

## 2016-06-29 NOTE — Telephone Encounter (Signed)
Spoke with patient. Patient request to see Dr. Sabra Heck in reference to new diagnosis of breast cancer she received this morning. Patient states she does not know what to do. Advised patient Breast Navigator from the cancer center will follow-up to assist with recommendations, scheduling and guidance. Patient states she values Dr. Ammie Ferrier opinion and would like her advice as well. Advised patient Dr. Sabra Heck is in surgery this morning and no afternoon appointments currently available. Advised patient I will review schedule with Dr. Sabra Heck when she returns to office for recommendations. Patient is agreeable.    Dr. Sabra Heck, please advise?

## 2016-06-29 NOTE — Telephone Encounter (Signed)
Patient called and said, "I was just given the news I have breast cancer this morning. I am not sure who to turn to so I called your office. I need to know what to do from here." I reassured the patient we are here to help her and that a nurse will get back with her to help make a plan and answer questions.  Last seen: 08/19/15.

## 2016-06-29 NOTE — Telephone Encounter (Signed)
Spoke with patient after speaking with Dr. Sabra Heck. Advised Dr. Sabra Heck will return call to patient. Advised patient Dr. Sabra Heck is seeing patients, call will be later in evening. Patient thankful and verbalizes understanding.

## 2016-07-06 DIAGNOSIS — D0511 Intraductal carcinoma in situ of right breast: Secondary | ICD-10-CM | POA: Insufficient documentation

## 2016-12-07 ENCOUNTER — Ambulatory Visit: Payer: BLUE CROSS/BLUE SHIELD | Admitting: Obstetrics & Gynecology

## 2017-01-21 ENCOUNTER — Ambulatory Visit (INDEPENDENT_AMBULATORY_CARE_PROVIDER_SITE_OTHER): Payer: BLUE CROSS/BLUE SHIELD | Admitting: Physician Assistant

## 2017-01-21 ENCOUNTER — Encounter: Payer: Self-pay | Admitting: Physician Assistant

## 2017-01-21 VITALS — BP 147/71 | HR 65 | Temp 97.7°F | Resp 18 | Ht 64.0 in | Wt 120.4 lb

## 2017-01-21 DIAGNOSIS — D72829 Elevated white blood cell count, unspecified: Secondary | ICD-10-CM | POA: Diagnosis not present

## 2017-01-21 DIAGNOSIS — R300 Dysuria: Secondary | ICD-10-CM | POA: Diagnosis not present

## 2017-01-21 LAB — POCT CBC
Granulocyte percent: 54.7 %G (ref 37–80)
HEMATOCRIT: 37.7 % (ref 37.7–47.9)
HEMOGLOBIN: 12.9 g/dL (ref 12.2–16.2)
LYMPH, POC: 1.4 (ref 0.6–3.4)
MCH: 31.9 pg — AB (ref 27–31.2)
MCHC: 34.3 g/dL (ref 31.8–35.4)
MCV: 93 fL (ref 80–97)
MID (cbc): 0.2 (ref 0–0.9)
MPV: 7.9 fL (ref 0–99.8)
PLATELET COUNT, POC: 215 10*3/uL (ref 142–424)
POC Granulocyte: 1.9 — AB (ref 2–6.9)
POC LYMPH PERCENT: 38.8 %L (ref 10–50)
POC MID %: 6.5 %M (ref 0–12)
RBC: 4.06 M/uL (ref 4.04–5.48)
RDW, POC: 14.3 %
WBC: 3.5 10*3/uL — AB (ref 4.6–10.2)

## 2017-01-21 LAB — POCT URINALYSIS DIP (MANUAL ENTRY)
BILIRUBIN UA: NEGATIVE
Glucose, UA: NEGATIVE mg/dL
Ketones, POC UA: NEGATIVE mg/dL
NITRITE UA: NEGATIVE
PH UA: 5.5 (ref 5.0–8.0)
PROTEIN UA: NEGATIVE mg/dL
Spec Grav, UA: <=1.005 — AB (ref 1.010–1.025)
Urobilinogen, UA: 0.2 E.U./dL

## 2017-01-21 LAB — POC MICROSCOPIC URINALYSIS (UMFC): MUCUS RE: ABSENT

## 2017-01-21 NOTE — Assessment & Plan Note (Signed)
On the right - removed

## 2017-01-21 NOTE — Progress Notes (Signed)
Suzanne Bishop  MRN: [id number] DOB: 07/26/1951  PCP: Harlan Stains, MD  Chief Complaint  Patient presents with  . Dysuria    bubbles in urine, frequency.    Subjective:  Pt presents to clinic for discussion of her low WBC.  It started in the fall when she was on radiation and the last time it was checked was in Jan 2018 and it was 3 - she has been told she needs to see her PCP.  She feels fine and has not been more sick than normal within the last 6 months.  She has also been told that she had bubbles in her bladder on CT but no fistula could be found - she does not have the report with her today.  She is having no urinary symptoms but does routinely have WBCs in her urine - her urine culture on 5/4 was neg.  She does have vaginal dryness.  She wanted me to know about it but she really wants to deal with her WBCs today.  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  Genitourinary: Negative for decreased urine volume, dysuria, hematuria and urgency.    Patient Active Problem List   Diagnosis Date Noted  . 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 Genetic counseling and testing 11/18/2015  . Hashimoto's disease 07/31/2015  . Pheochromocytoma 07/31/2015  . Smoker 06/04/2014  . OAB (overactive bladder) 06/04/2014  . 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 Acne erythematosa 01/31/2012    Current Outpatient Prescriptions on File Prior to Visit  Medication Sig Dispense Refill  . Ascorbic Acid (VITAMIN C) 100 MG tablet Take 100 mg by mouth daily.    . calcium carbonate (OS-CAL - DOSED IN MG OF ELEMENTAL CALCIUM) 1250 (500 CA) MG tablet Take 600 mg by mouth.     . finasteride (PROSCAR) 5 MG tablet   1  .  metroNIDAZOLE (METROGEL) 0.75 % gel Apply topically 2 (two) times daily.    . Minoxidil (ROGAINE MENS) 5 % FOAM Apply topically.    . Multiple Vitamin (MULTIVITAMIN) tablet Take 1 tablet by mouth daily.    Marland Kitchen SYNTHROID 112 MCG tablet Take 100 mcg by mouth daily.   0  . VITAMIN D, CHOLECALCIFEROL, PO Take 400 Int'l Units by mouth daily.     No current facility-administered medications on file prior to visit.     Allergies  Allergen Reactions  . Statins Other (See Comments)    Muscles sore  . Sulfa Antibiotics Hives    Pt patients past, family and social history were reviewed and updated.   Objective:  BP (!) 147/71   Pulse 65   Temp 97.7 F (36.5 C) (Oral)   Resp 18   Ht 5\' 4"  (1.626 m)   Wt 120 lb 6.4 oz (54.6 kg)   LMP 08/13/1994   SpO2 96%   BMI 20.67 kg/m   Physical Exam  Constitutional: She is oriented to person, place, and time and well-developed, well-nourished, and in no distress.  HENT:  Head: Normocephalic and atraumatic.  Right Ear: Hearing and external ear normal.  Left Ear: Hearing and external ear normal.  Eyes: Conjunctivae are normal.  Neck: Normal range of motion.  Cardiovascular: Normal rate, regular rhythm and normal heart  sounds.   No murmur heard. Pulmonary/Chest: Effort normal and breath sounds normal. She has no wheezes.  Neurological: She is alert and oriented to person, place, and time. Gait normal.  Skin: Skin is warm and dry.  Psychiatric: Mood, memory, affect and judgment normal.  Vitals reviewed.  Results for orders placed or performed in visit on 01/21/17  POCT Microscopic Urinalysis ([facility])  Result Value Ref Range   WBC,UR,HPF,POC Moderate (A) None WBC/hpf   RBC,UR,HPF,POC None None RBC/hpf   Bacteria Few (A) None, Too numerous to count   Mucus Absent Absent   Epithelial Cells, UR Per Microscopy Few (A) None, Too numerous to count cells/hpf  POCT urinalysis dipstick  Result Value Ref Range   Color, UA yellow yellow   Clarity, UA  clear clear   Glucose, UA negative negative mg/dL   Bilirubin, UA negative negative   Ketones, POC UA negative negative mg/dL   Spec Grav, UA <=1.005 (A) 1.010 - 1.025   Blood, UA trace-intact (A) negative   pH, UA 5.5 5.0 - 8.0   Protein Ur, POC negative negative mg/dL   Urobilinogen, UA 0.2 0.2 or 1.0 E.U./dL   Nitrite, UA Negative Negative   Leukocytes, UA Small (1+) (A) Negative  POCT CBC  Result Value Ref Range   WBC 3.5 (A) 4.6 - 10.2 K/uL   Lymph, poc 1.4 0.6 - 3.4   POC LYMPH PERCENT 38.8 10 - 50 %L   MID (cbc) 0.2 0 - 0.9   POC MID % 6.5 0 - 12 %M   POC Granulocyte 1.9 (A) 2 - 6.9   Granulocyte percent 54.7 37 - 80 %G   RBC 4.06 4.04 - 5.48 M/uL   Hemoglobin 12.9 12.2 - 16.2 g/dL   HCT, POC 37.7 37.7 - 47.9 %   MCV 93.0 80 - 97 fL   MCH, POC 31.9 (A) 27 - 31.2 pg   MCHC 34.3 31.8 - 35.4 g/dL   RDW, POC 14.3 %   Platelet Count, POC 215 142 - 424 K/uL   MPV 7.9 0 - 99.8 fL    Assessment and Plan :  Dysuria - Plan: POCT Microscopic Urinalysis ([facility]), POCT urinalysis dipstick - pt has WBCs in her urine but she has no symptoms and her culture last week was normal.  She does not want to pursue the bubbles in her urine currently.  Leukocytosis, unspecified type - Plan: POCT CBC, Smear Review per Protocol - check peripheral smear and the patient will likely need a hematology referral due to new onset leukocytosis.   Windell Hummingbird PA-C  [facility] 01/21/2017 7:51 PM

## 2017-01-25 ENCOUNTER — Encounter: Payer: Self-pay | Admitting: Physician Assistant

## 2017-01-25 LAB — SMEAR REVIEW PER PROTOCOL

## 2017-01-27 ENCOUNTER — Other Ambulatory Visit: Payer: BLUE CROSS/BLUE SHIELD | Admitting: Family Medicine

## 2017-01-27 DIAGNOSIS — D72829 Elevated white blood cell count, unspecified: Secondary | ICD-10-CM

## 2017-01-27 NOTE — Addendum Note (Signed)
Addended by: Gari Crown D on: 01/27/2017 04:23 PM   Modules accepted: Orders

## 2017-01-27 NOTE — Progress Notes (Signed)
Did not examine patient -- here for labs

## 2017-01-31 ENCOUNTER — Telehealth: Payer: Self-pay | Admitting: Physician Assistant

## 2017-01-31 LAB — PATHOLOGIST SMEAR REVIEW
BASOS ABS: 0 10*3/uL (ref 0.0–0.2)
Basos: 0 %
EOS (ABSOLUTE): 0.1 10*3/uL (ref 0.0–0.4)
Eos: 4 %
HEMATOCRIT: 35.3 % (ref 34.0–46.6)
HEMOGLOBIN: 11.9 g/dL (ref 11.1–15.9)
Immature Grans (Abs): 0 10*3/uL (ref 0.0–0.1)
Immature Granulocytes: 1 %
LYMPHS ABS: 1.3 10*3/uL (ref 0.7–3.1)
Lymphs: 37 %
MCH: 31.4 pg (ref 26.6–33.0)
MCHC: 33.7 g/dL (ref 31.5–35.7)
MCV: 93 fL (ref 79–97)
MONOS ABS: 0.5 10*3/uL (ref 0.1–0.9)
Monocytes: 13 %
NEUTROS PCT: 45 %
Neutrophils Absolute: 1.6 10*3/uL (ref 1.4–7.0)
PATH REV RBC: NORMAL
PATH REV WBC: NORMAL
PLATELETS: 232 10*3/uL (ref 150–379)
Path Rev PLTs: NORMAL
RBC: 3.79 x10E6/uL (ref 3.77–5.28)
RDW: 13.7 % (ref 12.3–15.4)
WBC: 3.6 10*3/uL (ref 3.4–10.8)

## 2017-01-31 NOTE — Telephone Encounter (Signed)
Can we check on this mychart - I have not seen it - the results are not back yet with her results of the path smear of her CBC

## 2017-01-31 NOTE — Telephone Encounter (Signed)
Pt is concerned that she has messaged Sarah on My Chart and Had no response,states she needs to know the results of her labs and is requesting call back from [place] phone for pt is [phone]

## 2017-02-01 NOTE — Telephone Encounter (Signed)
PT CALLING ON LABS PLEASE RESPOND

## 2017-02-01 NOTE — Telephone Encounter (Signed)
See smear results an advise

## 2017-02-01 NOTE — Telephone Encounter (Signed)
Contacted patient by mychart

## 2017-05-05 DIAGNOSIS — J329 Chronic sinusitis, unspecified: Secondary | ICD-10-CM | POA: Insufficient documentation

## 2017-09-16 DIAGNOSIS — N6489 Other specified disorders of breast: Secondary | ICD-10-CM | POA: Diagnosis not present

## 2017-09-16 DIAGNOSIS — Z923 Personal history of irradiation: Secondary | ICD-10-CM | POA: Diagnosis not present

## 2017-09-16 DIAGNOSIS — N6320 Unspecified lump in the left breast, unspecified quadrant: Secondary | ICD-10-CM | POA: Diagnosis not present

## 2017-09-16 DIAGNOSIS — Z853 Personal history of malignant neoplasm of breast: Secondary | ICD-10-CM | POA: Diagnosis not present

## 2017-09-19 DIAGNOSIS — R945 Abnormal results of liver function studies: Secondary | ICD-10-CM | POA: Diagnosis not present

## 2017-10-17 DIAGNOSIS — M810 Age-related osteoporosis without current pathological fracture: Secondary | ICD-10-CM | POA: Diagnosis not present

## 2017-10-27 DIAGNOSIS — C7A8 Other malignant neuroendocrine tumors: Secondary | ICD-10-CM | POA: Diagnosis not present

## 2017-10-27 DIAGNOSIS — R7989 Other specified abnormal findings of blood chemistry: Secondary | ICD-10-CM | POA: Diagnosis not present

## 2017-10-27 DIAGNOSIS — M8580 Other specified disorders of bone density and structure, unspecified site: Secondary | ICD-10-CM | POA: Diagnosis not present

## 2017-10-27 DIAGNOSIS — E063 Autoimmune thyroiditis: Secondary | ICD-10-CM | POA: Diagnosis not present

## 2017-10-27 DIAGNOSIS — M810 Age-related osteoporosis without current pathological fracture: Secondary | ICD-10-CM | POA: Diagnosis not present

## 2017-10-27 DIAGNOSIS — D447 Neoplasm of uncertain behavior of aortic body and other paraganglia: Secondary | ICD-10-CM | POA: Diagnosis not present

## 2017-10-27 DIAGNOSIS — D0511 Intraductal carcinoma in situ of right breast: Secondary | ICD-10-CM | POA: Diagnosis not present

## 2017-10-31 DIAGNOSIS — Z9089 Acquired absence of other organs: Secondary | ICD-10-CM | POA: Diagnosis not present

## 2017-10-31 DIAGNOSIS — R911 Solitary pulmonary nodule: Secondary | ICD-10-CM | POA: Diagnosis not present

## 2017-10-31 DIAGNOSIS — Z08 Encounter for follow-up examination after completed treatment for malignant neoplasm: Secondary | ICD-10-CM | POA: Diagnosis not present

## 2017-10-31 DIAGNOSIS — D447 Neoplasm of uncertain behavior of aortic body and other paraganglia: Secondary | ICD-10-CM | POA: Diagnosis not present

## 2017-10-31 DIAGNOSIS — Z9889 Other specified postprocedural states: Secondary | ICD-10-CM | POA: Diagnosis not present

## 2017-10-31 DIAGNOSIS — D0511 Intraductal carcinoma in situ of right breast: Secondary | ICD-10-CM | POA: Diagnosis not present

## 2017-10-31 DIAGNOSIS — K219 Gastro-esophageal reflux disease without esophagitis: Secondary | ICD-10-CM | POA: Diagnosis not present

## 2017-10-31 DIAGNOSIS — K5900 Constipation, unspecified: Secondary | ICD-10-CM | POA: Diagnosis not present

## 2017-10-31 DIAGNOSIS — Z882 Allergy status to sulfonamides status: Secondary | ICD-10-CM | POA: Diagnosis not present

## 2017-10-31 DIAGNOSIS — Z85841 Personal history of malignant neoplasm of brain: Secondary | ICD-10-CM | POA: Diagnosis not present

## 2017-10-31 DIAGNOSIS — Z86000 Personal history of in-situ neoplasm of breast: Secondary | ICD-10-CM | POA: Diagnosis not present

## 2017-10-31 DIAGNOSIS — Z888 Allergy status to other drugs, medicaments and biological substances status: Secondary | ICD-10-CM | POA: Diagnosis not present

## 2017-10-31 DIAGNOSIS — Z86018 Personal history of other benign neoplasm: Secondary | ICD-10-CM | POA: Diagnosis not present

## 2017-11-15 DIAGNOSIS — Z923 Personal history of irradiation: Secondary | ICD-10-CM | POA: Diagnosis not present

## 2017-11-15 DIAGNOSIS — Z08 Encounter for follow-up examination after completed treatment for malignant neoplasm: Secondary | ICD-10-CM | POA: Diagnosis not present

## 2017-11-15 DIAGNOSIS — Z853 Personal history of malignant neoplasm of breast: Secondary | ICD-10-CM | POA: Diagnosis not present

## 2017-12-19 DIAGNOSIS — R740 Nonspecific elevation of levels of transaminase and lactic acid dehydrogenase [LDH]: Secondary | ICD-10-CM | POA: Diagnosis not present

## 2018-01-17 DIAGNOSIS — R740 Nonspecific elevation of levels of transaminase and lactic acid dehydrogenase [LDH]: Secondary | ICD-10-CM | POA: Diagnosis not present

## 2018-02-01 DIAGNOSIS — Z08 Encounter for follow-up examination after completed treatment for malignant neoplasm: Secondary | ICD-10-CM | POA: Diagnosis not present

## 2018-02-01 DIAGNOSIS — D0511 Intraductal carcinoma in situ of right breast: Secondary | ICD-10-CM | POA: Diagnosis not present

## 2018-02-01 DIAGNOSIS — R928 Other abnormal and inconclusive findings on diagnostic imaging of breast: Secondary | ICD-10-CM | POA: Diagnosis not present

## 2018-02-01 DIAGNOSIS — Z853 Personal history of malignant neoplasm of breast: Secondary | ICD-10-CM | POA: Diagnosis not present

## 2018-02-15 DIAGNOSIS — Z923 Personal history of irradiation: Secondary | ICD-10-CM | POA: Diagnosis not present

## 2018-02-15 DIAGNOSIS — Z888 Allergy status to other drugs, medicaments and biological substances status: Secondary | ICD-10-CM | POA: Diagnosis not present

## 2018-02-15 DIAGNOSIS — Z9889 Other specified postprocedural states: Secondary | ICD-10-CM | POA: Diagnosis not present

## 2018-02-15 DIAGNOSIS — D0511 Intraductal carcinoma in situ of right breast: Secondary | ICD-10-CM | POA: Diagnosis not present

## 2018-02-15 DIAGNOSIS — M810 Age-related osteoporosis without current pathological fracture: Secondary | ICD-10-CM | POA: Diagnosis not present

## 2018-02-15 DIAGNOSIS — Z882 Allergy status to sulfonamides status: Secondary | ICD-10-CM | POA: Diagnosis not present

## 2018-02-15 DIAGNOSIS — L659 Nonscarring hair loss, unspecified: Secondary | ICD-10-CM | POA: Diagnosis not present

## 2018-02-15 DIAGNOSIS — D484 Neoplasm of uncertain behavior of peritoneum: Secondary | ICD-10-CM | POA: Diagnosis not present

## 2018-02-15 DIAGNOSIS — Z87891 Personal history of nicotine dependence: Secondary | ICD-10-CM | POA: Diagnosis not present

## 2018-02-22 DIAGNOSIS — D225 Melanocytic nevi of trunk: Secondary | ICD-10-CM | POA: Diagnosis not present

## 2018-02-22 DIAGNOSIS — L578 Other skin changes due to chronic exposure to nonionizing radiation: Secondary | ICD-10-CM | POA: Diagnosis not present

## 2018-02-22 DIAGNOSIS — W57XXXA Bitten or stung by nonvenomous insect and other nonvenomous arthropods, initial encounter: Secondary | ICD-10-CM | POA: Diagnosis not present

## 2018-02-22 DIAGNOSIS — R740 Nonspecific elevation of levels of transaminase and lactic acid dehydrogenase [LDH]: Secondary | ICD-10-CM | POA: Diagnosis not present

## 2018-02-22 DIAGNOSIS — L659 Nonscarring hair loss, unspecified: Secondary | ICD-10-CM | POA: Diagnosis not present

## 2018-02-22 DIAGNOSIS — L821 Other seborrheic keratosis: Secondary | ICD-10-CM | POA: Diagnosis not present

## 2018-02-22 DIAGNOSIS — D1801 Hemangioma of skin and subcutaneous tissue: Secondary | ICD-10-CM | POA: Diagnosis not present

## 2018-02-22 DIAGNOSIS — L814 Other melanin hyperpigmentation: Secondary | ICD-10-CM | POA: Diagnosis not present

## 2018-02-22 DIAGNOSIS — L84 Corns and callosities: Secondary | ICD-10-CM | POA: Diagnosis not present

## 2018-04-26 DIAGNOSIS — E063 Autoimmune thyroiditis: Secondary | ICD-10-CM | POA: Diagnosis not present

## 2018-04-26 DIAGNOSIS — H35372 Puckering of macula, left eye: Secondary | ICD-10-CM | POA: Diagnosis not present

## 2018-04-26 DIAGNOSIS — Z79899 Other long term (current) drug therapy: Secondary | ICD-10-CM | POA: Diagnosis not present

## 2018-04-26 DIAGNOSIS — D447 Neoplasm of uncertain behavior of aortic body and other paraganglia: Secondary | ICD-10-CM | POA: Diagnosis not present

## 2018-04-26 DIAGNOSIS — C7A00 Malignant carcinoid tumor of unspecified site: Secondary | ICD-10-CM | POA: Diagnosis not present

## 2018-04-26 DIAGNOSIS — H524 Presbyopia: Secondary | ICD-10-CM | POA: Diagnosis not present

## 2018-04-26 DIAGNOSIS — H2513 Age-related nuclear cataract, bilateral: Secondary | ICD-10-CM | POA: Diagnosis not present

## 2018-05-01 DIAGNOSIS — Z923 Personal history of irradiation: Secondary | ICD-10-CM | POA: Diagnosis not present

## 2018-05-01 DIAGNOSIS — D0511 Intraductal carcinoma in situ of right breast: Secondary | ICD-10-CM | POA: Diagnosis not present

## 2018-05-01 DIAGNOSIS — Z853 Personal history of malignant neoplasm of breast: Secondary | ICD-10-CM | POA: Diagnosis not present

## 2018-05-01 DIAGNOSIS — Z87891 Personal history of nicotine dependence: Secondary | ICD-10-CM | POA: Diagnosis not present

## 2018-05-01 DIAGNOSIS — D0512 Intraductal carcinoma in situ of left breast: Secondary | ICD-10-CM | POA: Diagnosis not present

## 2018-05-29 DIAGNOSIS — D0511 Intraductal carcinoma in situ of right breast: Secondary | ICD-10-CM | POA: Diagnosis not present

## 2018-05-29 DIAGNOSIS — D447 Neoplasm of uncertain behavior of aortic body and other paraganglia: Secondary | ICD-10-CM | POA: Diagnosis not present

## 2018-05-29 DIAGNOSIS — Z510 Encounter for antineoplastic radiation therapy: Secondary | ICD-10-CM | POA: Diagnosis not present

## 2018-05-29 DIAGNOSIS — N644 Mastodynia: Secondary | ICD-10-CM | POA: Diagnosis not present

## 2018-05-29 DIAGNOSIS — Z9011 Acquired absence of right breast and nipple: Secondary | ICD-10-CM | POA: Diagnosis not present

## 2018-05-29 DIAGNOSIS — N63 Unspecified lump in unspecified breast: Secondary | ICD-10-CM | POA: Diagnosis not present

## 2018-05-29 DIAGNOSIS — E340 Carcinoid syndrome: Secondary | ICD-10-CM | POA: Diagnosis not present

## 2018-05-29 DIAGNOSIS — Z86000 Personal history of in-situ neoplasm of breast: Secondary | ICD-10-CM | POA: Diagnosis not present

## 2018-06-05 DIAGNOSIS — Z23 Encounter for immunization: Secondary | ICD-10-CM | POA: Diagnosis not present

## 2018-06-19 ENCOUNTER — Other Ambulatory Visit: Payer: Self-pay | Admitting: Registered Nurse

## 2018-06-19 ENCOUNTER — Ambulatory Visit
Admission: RE | Admit: 2018-06-19 | Discharge: 2018-06-19 | Disposition: A | Discharge status: Home / Self Care | Payer: Medicare Other | Source: Ambulatory Visit | Attending: Registered Nurse | Admitting: Registered Nurse

## 2018-06-19 DIAGNOSIS — N200 Calculus of kidney: Secondary | ICD-10-CM | POA: Diagnosis not present

## 2018-06-19 DIAGNOSIS — R109 Unspecified abdominal pain: Secondary | ICD-10-CM

## 2018-06-19 DIAGNOSIS — Z6821 Body mass index (BMI) 21.0-21.9, adult: Secondary | ICD-10-CM | POA: Diagnosis not present

## 2018-06-19 DIAGNOSIS — R310 Gross hematuria: Secondary | ICD-10-CM | POA: Diagnosis not present

## 2018-06-19 DIAGNOSIS — R52 Pain, unspecified: Secondary | ICD-10-CM

## 2018-06-19 DIAGNOSIS — R1084 Generalized abdominal pain: Secondary | ICD-10-CM | POA: Diagnosis not present

## 2018-06-29 DIAGNOSIS — F310 Bipolar disorder, current episode hypomanic: Secondary | ICD-10-CM | POA: Diagnosis not present

## 2018-06-29 DIAGNOSIS — Z87442 Personal history of urinary calculi: Secondary | ICD-10-CM | POA: Diagnosis not present

## 2018-06-29 DIAGNOSIS — R319 Hematuria, unspecified: Secondary | ICD-10-CM | POA: Diagnosis not present

## 2018-06-29 DIAGNOSIS — N3946 Mixed incontinence: Secondary | ICD-10-CM | POA: Diagnosis not present

## 2018-07-03 DIAGNOSIS — D447 Neoplasm of uncertain behavior of aortic body and other paraganglia: Secondary | ICD-10-CM | POA: Diagnosis not present

## 2018-07-03 DIAGNOSIS — R319 Hematuria, unspecified: Secondary | ICD-10-CM | POA: Diagnosis not present

## 2018-07-24 DIAGNOSIS — R102 Pelvic and perineal pain: Secondary | ICD-10-CM | POA: Diagnosis not present

## 2018-07-24 DIAGNOSIS — R1030 Lower abdominal pain, unspecified: Secondary | ICD-10-CM | POA: Diagnosis not present

## 2018-07-24 DIAGNOSIS — D1809 Hemangioma of other sites: Secondary | ICD-10-CM | POA: Diagnosis not present

## 2018-07-24 DIAGNOSIS — R310 Gross hematuria: Secondary | ICD-10-CM | POA: Diagnosis not present

## 2018-07-24 DIAGNOSIS — K6289 Other specified diseases of anus and rectum: Secondary | ICD-10-CM | POA: Diagnosis not present

## 2018-07-24 DIAGNOSIS — N281 Cyst of kidney, acquired: Secondary | ICD-10-CM | POA: Diagnosis not present

## 2018-07-24 DIAGNOSIS — N200 Calculus of kidney: Secondary | ICD-10-CM | POA: Diagnosis not present

## 2018-09-04 DIAGNOSIS — H109 Unspecified conjunctivitis: Secondary | ICD-10-CM | POA: Diagnosis not present

## 2018-09-04 DIAGNOSIS — J019 Acute sinusitis, unspecified: Secondary | ICD-10-CM | POA: Diagnosis not present

## 2018-09-08 DIAGNOSIS — Z79899 Other long term (current) drug therapy: Secondary | ICD-10-CM | POA: Diagnosis not present

## 2018-09-08 DIAGNOSIS — E7849 Other hyperlipidemia: Secondary | ICD-10-CM | POA: Diagnosis not present

## 2018-09-08 DIAGNOSIS — R82998 Other abnormal findings in urine: Secondary | ICD-10-CM | POA: Diagnosis not present

## 2018-09-15 ENCOUNTER — Other Ambulatory Visit: Payer: Self-pay | Admitting: Internal Medicine

## 2018-09-15 DIAGNOSIS — J328 Other chronic sinusitis: Secondary | ICD-10-CM | POA: Diagnosis not present

## 2018-09-15 DIAGNOSIS — Z1389 Encounter for screening for other disorder: Secondary | ICD-10-CM | POA: Diagnosis not present

## 2018-09-15 DIAGNOSIS — K219 Gastro-esophageal reflux disease without esophagitis: Secondary | ICD-10-CM | POA: Diagnosis not present

## 2018-09-15 DIAGNOSIS — Z6820 Body mass index (BMI) 20.0-20.9, adult: Secondary | ICD-10-CM | POA: Diagnosis not present

## 2018-09-15 DIAGNOSIS — K8020 Calculus of gallbladder without cholecystitis without obstruction: Secondary | ICD-10-CM | POA: Diagnosis not present

## 2018-09-15 DIAGNOSIS — R740 Nonspecific elevation of levels of transaminase and lactic acid dehydrogenase [LDH]: Secondary | ICD-10-CM | POA: Diagnosis not present

## 2018-09-15 DIAGNOSIS — Z23 Encounter for immunization: Secondary | ICD-10-CM | POA: Diagnosis not present

## 2018-09-15 DIAGNOSIS — D447 Neoplasm of uncertain behavior of aortic body and other paraganglia: Secondary | ICD-10-CM | POA: Diagnosis not present

## 2018-09-15 DIAGNOSIS — Z0000 Encounter for general adult medical examination without abnormal findings: Secondary | ICD-10-CM | POA: Diagnosis not present

## 2018-09-15 DIAGNOSIS — R935 Abnormal findings on diagnostic imaging of other abdominal regions, including retroperitoneum: Secondary | ICD-10-CM

## 2018-09-15 DIAGNOSIS — R110 Nausea: Secondary | ICD-10-CM

## 2018-09-15 DIAGNOSIS — M810 Age-related osteoporosis without current pathological fracture: Secondary | ICD-10-CM | POA: Diagnosis not present

## 2018-09-20 ENCOUNTER — Ambulatory Visit
Admission: RE | Admit: 2018-09-20 | Discharge: 2018-09-20 | Disposition: A | Discharge status: Home / Self Care | Payer: Medicare Other | Source: Ambulatory Visit | Attending: Internal Medicine | Admitting: Internal Medicine

## 2018-09-20 DIAGNOSIS — R110 Nausea: Secondary | ICD-10-CM | POA: Diagnosis not present

## 2018-09-20 DIAGNOSIS — R935 Abnormal findings on diagnostic imaging of other abdominal regions, including retroperitoneum: Secondary | ICD-10-CM

## 2018-09-20 DIAGNOSIS — K8020 Calculus of gallbladder without cholecystitis without obstruction: Secondary | ICD-10-CM

## 2018-09-21 DIAGNOSIS — Z1212 Encounter for screening for malignant neoplasm of rectum: Secondary | ICD-10-CM | POA: Diagnosis not present

## 2018-10-06 DIAGNOSIS — R109 Unspecified abdominal pain: Secondary | ICD-10-CM | POA: Diagnosis not present

## 2018-10-06 DIAGNOSIS — R945 Abnormal results of liver function studies: Secondary | ICD-10-CM | POA: Diagnosis not present

## 2018-10-06 DIAGNOSIS — R195 Other fecal abnormalities: Secondary | ICD-10-CM | POA: Diagnosis not present

## 2018-10-30 DIAGNOSIS — K5900 Constipation, unspecified: Secondary | ICD-10-CM | POA: Diagnosis not present

## 2018-10-30 DIAGNOSIS — D483 Neoplasm of uncertain behavior of retroperitoneum: Secondary | ICD-10-CM | POA: Diagnosis not present

## 2018-10-30 DIAGNOSIS — Z4889 Encounter for other specified surgical aftercare: Secondary | ICD-10-CM | POA: Diagnosis not present

## 2018-10-30 DIAGNOSIS — N200 Calculus of kidney: Secondary | ICD-10-CM | POA: Diagnosis not present

## 2018-10-30 DIAGNOSIS — E896 Postprocedural adrenocortical (-medullary) hypofunction: Secondary | ICD-10-CM | POA: Diagnosis not present

## 2018-10-30 DIAGNOSIS — N281 Cyst of kidney, acquired: Secondary | ICD-10-CM | POA: Diagnosis not present

## 2018-10-30 DIAGNOSIS — E278 Other specified disorders of adrenal gland: Secondary | ICD-10-CM | POA: Diagnosis not present

## 2018-11-01 DIAGNOSIS — E896 Postprocedural adrenocortical (-medullary) hypofunction: Secondary | ICD-10-CM | POA: Diagnosis not present

## 2018-11-01 DIAGNOSIS — M810 Age-related osteoporosis without current pathological fracture: Secondary | ICD-10-CM | POA: Diagnosis not present

## 2018-11-01 DIAGNOSIS — Z9889 Other specified postprocedural states: Secondary | ICD-10-CM | POA: Diagnosis not present

## 2018-11-01 DIAGNOSIS — C7A00 Malignant carcinoid tumor of unspecified site: Secondary | ICD-10-CM | POA: Diagnosis not present

## 2018-11-01 DIAGNOSIS — D483 Neoplasm of uncertain behavior of retroperitoneum: Secondary | ICD-10-CM | POA: Diagnosis not present

## 2018-11-01 DIAGNOSIS — D0512 Intraductal carcinoma in situ of left breast: Secondary | ICD-10-CM | POA: Diagnosis not present

## 2018-11-01 DIAGNOSIS — E063 Autoimmune thyroiditis: Secondary | ICD-10-CM | POA: Diagnosis not present

## 2018-11-01 DIAGNOSIS — D0511 Intraductal carcinoma in situ of right breast: Secondary | ICD-10-CM | POA: Diagnosis not present

## 2018-11-27 DIAGNOSIS — R109 Unspecified abdominal pain: Secondary | ICD-10-CM | POA: Diagnosis not present

## 2018-11-27 DIAGNOSIS — R195 Other fecal abnormalities: Secondary | ICD-10-CM | POA: Diagnosis not present

## 2018-12-29 DIAGNOSIS — R195 Other fecal abnormalities: Secondary | ICD-10-CM | POA: Diagnosis not present

## 2019-01-24 DIAGNOSIS — H43812 Vitreous degeneration, left eye: Secondary | ICD-10-CM | POA: Diagnosis not present

## 2019-02-01 DIAGNOSIS — W57XXXA Bitten or stung by nonvenomous insect and other nonvenomous arthropods, initial encounter: Secondary | ICD-10-CM | POA: Diagnosis not present

## 2019-02-01 DIAGNOSIS — L0889 Other specified local infections of the skin and subcutaneous tissue: Secondary | ICD-10-CM | POA: Diagnosis not present

## 2019-02-02 DIAGNOSIS — W57XXXS Bitten or stung by nonvenomous insect and other nonvenomous arthropods, sequela: Secondary | ICD-10-CM | POA: Diagnosis not present

## 2019-02-02 DIAGNOSIS — L0889 Other specified local infections of the skin and subcutaneous tissue: Secondary | ICD-10-CM | POA: Diagnosis not present

## 2019-02-07 DIAGNOSIS — Z853 Personal history of malignant neoplasm of breast: Secondary | ICD-10-CM | POA: Diagnosis not present

## 2019-02-07 DIAGNOSIS — N6315 Unspecified lump in the right breast, overlapping quadrants: Secondary | ICD-10-CM | POA: Diagnosis not present

## 2019-02-07 DIAGNOSIS — Z1239 Encounter for other screening for malignant neoplasm of breast: Secondary | ICD-10-CM | POA: Diagnosis not present

## 2019-02-07 DIAGNOSIS — D0511 Intraductal carcinoma in situ of right breast: Secondary | ICD-10-CM | POA: Diagnosis not present

## 2019-02-07 DIAGNOSIS — Z09 Encounter for follow-up examination after completed treatment for conditions other than malignant neoplasm: Secondary | ICD-10-CM | POA: Diagnosis not present

## 2019-02-07 DIAGNOSIS — N6313 Unspecified lump in the right breast, lower outer quadrant: Secondary | ICD-10-CM | POA: Diagnosis not present

## 2019-02-07 DIAGNOSIS — Z86000 Personal history of in-situ neoplasm of breast: Secondary | ICD-10-CM | POA: Diagnosis not present

## 2019-02-08 DIAGNOSIS — H43812 Vitreous degeneration, left eye: Secondary | ICD-10-CM | POA: Diagnosis not present

## 2019-03-19 DIAGNOSIS — L814 Other melanin hyperpigmentation: Secondary | ICD-10-CM | POA: Diagnosis not present

## 2019-03-19 DIAGNOSIS — L719 Rosacea, unspecified: Secondary | ICD-10-CM | POA: Diagnosis not present

## 2019-03-19 DIAGNOSIS — M1990 Unspecified osteoarthritis, unspecified site: Secondary | ICD-10-CM | POA: Diagnosis not present

## 2019-03-19 DIAGNOSIS — D225 Melanocytic nevi of trunk: Secondary | ICD-10-CM | POA: Diagnosis not present

## 2019-03-19 DIAGNOSIS — L821 Other seborrheic keratosis: Secondary | ICD-10-CM | POA: Diagnosis not present

## 2019-03-19 DIAGNOSIS — L600 Ingrowing nail: Secondary | ICD-10-CM | POA: Diagnosis not present

## 2019-04-04 DIAGNOSIS — R740 Nonspecific elevation of levels of transaminase and lactic acid dehydrogenase [LDH]: Secondary | ICD-10-CM | POA: Diagnosis not present

## 2019-04-04 DIAGNOSIS — R310 Gross hematuria: Secondary | ICD-10-CM | POA: Diagnosis not present

## 2019-04-04 DIAGNOSIS — R918 Other nonspecific abnormal finding of lung field: Secondary | ICD-10-CM | POA: Diagnosis not present

## 2019-04-04 DIAGNOSIS — C50919 Malignant neoplasm of unspecified site of unspecified female breast: Secondary | ICD-10-CM | POA: Diagnosis not present

## 2019-04-04 DIAGNOSIS — M79676 Pain in unspecified toe(s): Secondary | ICD-10-CM | POA: Diagnosis not present

## 2019-04-04 DIAGNOSIS — M19049 Primary osteoarthritis, unspecified hand: Secondary | ICD-10-CM | POA: Diagnosis not present

## 2019-04-04 DIAGNOSIS — D447 Neoplasm of uncertain behavior of aortic body and other paraganglia: Secondary | ICD-10-CM | POA: Diagnosis not present

## 2019-04-30 DIAGNOSIS — H43812 Vitreous degeneration, left eye: Secondary | ICD-10-CM | POA: Diagnosis not present

## 2019-04-30 DIAGNOSIS — H2513 Age-related nuclear cataract, bilateral: Secondary | ICD-10-CM | POA: Diagnosis not present

## 2019-04-30 DIAGNOSIS — H35372 Puckering of macula, left eye: Secondary | ICD-10-CM | POA: Diagnosis not present

## 2019-04-30 DIAGNOSIS — H5203 Hypermetropia, bilateral: Secondary | ICD-10-CM | POA: Diagnosis not present

## 2019-05-07 DIAGNOSIS — C7A8 Other malignant neuroendocrine tumors: Secondary | ICD-10-CM | POA: Diagnosis not present

## 2019-05-07 DIAGNOSIS — M2550 Pain in unspecified joint: Secondary | ICD-10-CM | POA: Diagnosis not present

## 2019-05-07 DIAGNOSIS — E063 Autoimmune thyroiditis: Secondary | ICD-10-CM | POA: Diagnosis not present

## 2019-05-16 DIAGNOSIS — Z08 Encounter for follow-up examination after completed treatment for malignant neoplasm: Secondary | ICD-10-CM | POA: Diagnosis not present

## 2019-05-16 DIAGNOSIS — Z923 Personal history of irradiation: Secondary | ICD-10-CM | POA: Diagnosis not present

## 2019-05-16 DIAGNOSIS — Z87891 Personal history of nicotine dependence: Secondary | ICD-10-CM | POA: Diagnosis not present

## 2019-05-16 DIAGNOSIS — Z853 Personal history of malignant neoplasm of breast: Secondary | ICD-10-CM | POA: Diagnosis not present

## 2019-05-16 DIAGNOSIS — D0511 Intraductal carcinoma in situ of right breast: Secondary | ICD-10-CM | POA: Diagnosis not present

## 2019-05-16 DIAGNOSIS — Z9011 Acquired absence of right breast and nipple: Secondary | ICD-10-CM | POA: Diagnosis not present

## 2019-05-16 DIAGNOSIS — D0512 Intraductal carcinoma in situ of left breast: Secondary | ICD-10-CM | POA: Diagnosis not present

## 2019-05-19 DIAGNOSIS — Z23 Encounter for immunization: Secondary | ICD-10-CM | POA: Diagnosis not present

## 2019-06-25 ENCOUNTER — Ambulatory Visit (INDEPENDENT_AMBULATORY_CARE_PROVIDER_SITE_OTHER): Payer: Medicare Other | Admitting: Podiatry

## 2019-06-25 ENCOUNTER — Other Ambulatory Visit: Payer: Self-pay

## 2019-06-25 VITALS — Temp 97.7°F

## 2019-06-25 DIAGNOSIS — B351 Tinea unguium: Secondary | ICD-10-CM

## 2019-06-25 DIAGNOSIS — L600 Ingrowing nail: Secondary | ICD-10-CM

## 2019-06-25 NOTE — Patient Instructions (Signed)

## 2019-06-28 NOTE — Progress Notes (Signed)
   Subjective: Patient presents today for evaluation of intermittent burning pain to the medial border of the left hallux that began about five months ago. Patient is concerned for possible ingrown nail. Touching the area increases the pain. She has not had any treatment for the symptoms.  She also complains of intermittent burning pain to the 1st and 2nd nails of the bilateral feet that began over a year ago. She reports associated thickening of the nails. She has not had any treatment for the symptoms and is concerned for fungus. She denies modifying factors. Patient presents today for further treatment and evaluation.  Past Medical History:  Diagnosis Date  . Allergy   . Androgenic alopecia   . Asthma   . Cancer ([place])   . Osteopenia   . Pheochromocytoma of right adrenal gland    2016  . Thyroid disease    hypothyroid    Objective:  General: Well developed, nourished, in no acute distress, alert and oriented x3   Dermatology: Skin is warm, dry and supple bilateral. Medial border left hallux appears to be erythematous with evidence of an ingrowing nail. Pain on palpation noted to the border of the nail fold. Hyperkeratotic, discolored, thickened, onychodystrophy of nails 1-5 bilaterally. There are no open sores, lesions.  Vascular: Dorsalis Pedis artery and Posterior Tibial artery pedal pulses palpable. No lower extremity edema noted.   Neruologic: Grossly intact via light touch bilateral.  Musculoskeletal: Muscular strength within normal limits in all groups bilateral. Normal range of motion noted to all pedal and ankle joints.   Assesement: #1 Paronychia with ingrowing nail medial border left hallux #2 Pain in toe #3 Incurvated nail #4 Onychomycosis nails 1-5 bilateral   Plan of Care:  1. Patient evaluated.  2. Discussed treatment alternatives and plan of care. Explained nail avulsion procedure and post procedure course to patient. 3. Patient opted for permanent partial nail  avulsion of the medial border left great toe.  4. Prior to procedure, local anesthesia infiltration utilized using 3 ml of a 50:50 mixture of 2% plain lidocaine and 0.5% plain marcaine in a normal hallux block fashion and a betadine prep performed.  5. Partial permanent nail avulsion with chemical matrixectomy performed using [id number] applications of phenol followed by alcohol flush.  6. Light dressing applied. 7. Recommended OTC antifungal topical treatment daily.  8. Return to clinic in 2 weeks.  Edrick Kins, DPM [facility]  Dr. Edrick Kins, [address]                                        [place], [place] [address]                Office [phone]  Fax [phone]

## 2019-06-29 DIAGNOSIS — Z923 Personal history of irradiation: Secondary | ICD-10-CM | POA: Diagnosis not present

## 2019-06-29 DIAGNOSIS — D0511 Intraductal carcinoma in situ of right breast: Secondary | ICD-10-CM | POA: Diagnosis not present

## 2019-06-29 DIAGNOSIS — Z87891 Personal history of nicotine dependence: Secondary | ICD-10-CM | POA: Diagnosis not present

## 2019-06-29 DIAGNOSIS — Z9011 Acquired absence of right breast and nipple: Secondary | ICD-10-CM | POA: Diagnosis not present

## 2019-07-09 ENCOUNTER — Ambulatory Visit (INDEPENDENT_AMBULATORY_CARE_PROVIDER_SITE_OTHER): Payer: Medicare Other | Admitting: Podiatry

## 2019-07-09 ENCOUNTER — Other Ambulatory Visit: Payer: Self-pay

## 2019-07-09 DIAGNOSIS — B351 Tinea unguium: Secondary | ICD-10-CM

## 2019-07-09 DIAGNOSIS — L600 Ingrowing nail: Secondary | ICD-10-CM | POA: Diagnosis not present

## 2019-07-11 NOTE — Progress Notes (Signed)
   Subjective: 68 y.o. female presents today status post permanent nail avulsion procedure of the medial border of the left hallux that was performed on 06/25/2019. She states she is doing well but reports continued soreness of the toe. She reports associated redness. Touching the area or applying pressure increases the pain. Patient is here for further evaluation and treatment.   Past Medical History:  Diagnosis Date  . Allergy   . Androgenic alopecia   . Asthma   . Cancer ([place])   . Osteopenia   . Pheochromocytoma of right adrenal gland    2016  . Thyroid disease    hypothyroid    Objective: Skin is warm, dry and supple. Nail and respective nail fold appears to be healing appropriately. Open wound to the associated nail fold with a granular wound base and moderate amount of fibrotic tissue. Minimal drainage noted. Mild erythema around the periungual region likely due to phenol chemical matricectomy. Hyperkeratotic, discolored, thickened, onychodystrophy of nails 1-5 bilaterally.   Assessment: #1 postop permanent partial nail avulsion medial border left hallux  #2 open wound periungual nail fold of respective digit.  #3 Onychomycosis nails 1-5 bilaterally   Plan of care: #1 patient was evaluated  #2 debridement of open wound was performed to the periungual border of the respective toe using a currette. Antibiotic ointment and Band-Aid was applied. #3 Continue using Formula 3 topical antifungal solution daily.  #4 patient is to return to clinic on a PRN basis.   Edrick Kins, DPM [facility]  Dr. Edrick Kins, [address]                                        [place], [place] [address]                Office [phone]  Fax [phone]

## 2019-07-16 ENCOUNTER — Ambulatory Visit: Payer: Medicare Other | Admitting: Podiatry

## 2019-07-24 DIAGNOSIS — H109 Unspecified conjunctivitis: Secondary | ICD-10-CM | POA: Diagnosis not present

## 2019-08-06 DIAGNOSIS — Z87448 Personal history of other diseases of urinary system: Secondary | ICD-10-CM | POA: Diagnosis not present

## 2019-08-06 DIAGNOSIS — Z87442 Personal history of urinary calculi: Secondary | ICD-10-CM | POA: Diagnosis not present

## 2019-08-06 DIAGNOSIS — N200 Calculus of kidney: Secondary | ICD-10-CM | POA: Diagnosis not present

## 2019-08-06 DIAGNOSIS — N281 Cyst of kidney, acquired: Secondary | ICD-10-CM | POA: Diagnosis not present

## 2019-08-06 DIAGNOSIS — R3129 Other microscopic hematuria: Secondary | ICD-10-CM | POA: Diagnosis not present

## 2019-09-12 DIAGNOSIS — Z79899 Other long term (current) drug therapy: Secondary | ICD-10-CM | POA: Diagnosis not present

## 2019-09-12 DIAGNOSIS — Z1212 Encounter for screening for malignant neoplasm of rectum: Secondary | ICD-10-CM | POA: Diagnosis not present

## 2019-09-17 DIAGNOSIS — J019 Acute sinusitis, unspecified: Secondary | ICD-10-CM | POA: Diagnosis not present

## 2019-09-17 DIAGNOSIS — Z20828 Contact with and (suspected) exposure to other viral communicable diseases: Secondary | ICD-10-CM | POA: Diagnosis not present

## 2019-09-19 DIAGNOSIS — J329 Chronic sinusitis, unspecified: Secondary | ICD-10-CM | POA: Diagnosis not present

## 2019-09-19 DIAGNOSIS — C50919 Malignant neoplasm of unspecified site of unspecified female breast: Secondary | ICD-10-CM | POA: Diagnosis not present

## 2019-09-19 DIAGNOSIS — Z0000 Encounter for general adult medical examination without abnormal findings: Secondary | ICD-10-CM | POA: Diagnosis not present

## 2019-09-19 DIAGNOSIS — E785 Hyperlipidemia, unspecified: Secondary | ICD-10-CM | POA: Diagnosis not present

## 2019-09-19 DIAGNOSIS — M19049 Primary osteoarthritis, unspecified hand: Secondary | ICD-10-CM | POA: Diagnosis not present

## 2019-09-19 DIAGNOSIS — Z1331 Encounter for screening for depression: Secondary | ICD-10-CM | POA: Diagnosis not present

## 2019-09-19 DIAGNOSIS — K219 Gastro-esophageal reflux disease without esophagitis: Secondary | ICD-10-CM | POA: Diagnosis not present

## 2019-09-19 DIAGNOSIS — M810 Age-related osteoporosis without current pathological fracture: Secondary | ICD-10-CM | POA: Diagnosis not present

## 2019-09-19 DIAGNOSIS — E063 Autoimmune thyroiditis: Secondary | ICD-10-CM | POA: Diagnosis not present

## 2019-10-26 DIAGNOSIS — Z23 Encounter for immunization: Secondary | ICD-10-CM | POA: Diagnosis not present

## 2019-11-07 DIAGNOSIS — Z79899 Other long term (current) drug therapy: Secondary | ICD-10-CM | POA: Diagnosis not present

## 2019-11-07 DIAGNOSIS — Z8603 Personal history of neoplasm of uncertain behavior: Secondary | ICD-10-CM | POA: Diagnosis not present

## 2019-11-07 DIAGNOSIS — M169 Osteoarthritis of hip, unspecified: Secondary | ICD-10-CM | POA: Diagnosis not present

## 2019-11-07 DIAGNOSIS — M85851 Other specified disorders of bone density and structure, right thigh: Secondary | ICD-10-CM | POA: Diagnosis not present

## 2019-11-07 DIAGNOSIS — D447 Neoplasm of uncertain behavior of aortic body and other paraganglia: Secondary | ICD-10-CM | POA: Diagnosis not present

## 2019-11-07 DIAGNOSIS — M79642 Pain in left hand: Secondary | ICD-10-CM | POA: Diagnosis not present

## 2019-11-07 DIAGNOSIS — M79641 Pain in right hand: Secondary | ICD-10-CM | POA: Diagnosis not present

## 2019-11-07 DIAGNOSIS — E038 Other specified hypothyroidism: Secondary | ICD-10-CM | POA: Diagnosis not present

## 2019-11-07 DIAGNOSIS — E063 Autoimmune thyroiditis: Secondary | ICD-10-CM | POA: Diagnosis not present

## 2019-11-07 DIAGNOSIS — M549 Dorsalgia, unspecified: Secondary | ICD-10-CM | POA: Diagnosis not present

## 2019-11-07 DIAGNOSIS — Z79890 Hormone replacement therapy (postmenopausal): Secondary | ICD-10-CM | POA: Diagnosis not present

## 2019-11-07 DIAGNOSIS — C7A00 Malignant carcinoid tumor of unspecified site: Secondary | ICD-10-CM | POA: Diagnosis not present

## 2019-11-07 DIAGNOSIS — M8580 Other specified disorders of bone density and structure, unspecified site: Secondary | ICD-10-CM | POA: Diagnosis not present

## 2019-11-07 DIAGNOSIS — M85859 Other specified disorders of bone density and structure, unspecified thigh: Secondary | ICD-10-CM | POA: Diagnosis not present

## 2019-11-08 DIAGNOSIS — Z8505 Personal history of malignant neoplasm of liver: Secondary | ICD-10-CM | POA: Diagnosis not present

## 2019-11-08 DIAGNOSIS — R932 Abnormal findings on diagnostic imaging of liver and biliary tract: Secondary | ICD-10-CM | POA: Diagnosis not present

## 2019-11-08 DIAGNOSIS — R978 Other abnormal tumor markers: Secondary | ICD-10-CM | POA: Diagnosis not present

## 2019-11-08 DIAGNOSIS — D447 Neoplasm of uncertain behavior of aortic body and other paraganglia: Secondary | ICD-10-CM | POA: Diagnosis not present

## 2019-11-16 DIAGNOSIS — M8589 Other specified disorders of bone density and structure, multiple sites: Secondary | ICD-10-CM | POA: Diagnosis not present

## 2019-11-16 DIAGNOSIS — R932 Abnormal findings on diagnostic imaging of liver and biliary tract: Secondary | ICD-10-CM | POA: Diagnosis not present

## 2019-11-16 DIAGNOSIS — Z9889 Other specified postprocedural states: Secondary | ICD-10-CM | POA: Diagnosis not present

## 2019-11-16 DIAGNOSIS — C7A00 Malignant carcinoid tumor of unspecified site: Secondary | ICD-10-CM | POA: Diagnosis not present

## 2019-11-16 DIAGNOSIS — D0511 Intraductal carcinoma in situ of right breast: Secondary | ICD-10-CM | POA: Diagnosis not present

## 2019-11-16 DIAGNOSIS — Z87891 Personal history of nicotine dependence: Secondary | ICD-10-CM | POA: Diagnosis not present

## 2019-11-16 DIAGNOSIS — Z8603 Personal history of neoplasm of uncertain behavior: Secondary | ICD-10-CM | POA: Diagnosis not present

## 2019-11-16 DIAGNOSIS — Z08 Encounter for follow-up examination after completed treatment for malignant neoplasm: Secondary | ICD-10-CM | POA: Diagnosis not present

## 2019-11-16 DIAGNOSIS — R911 Solitary pulmonary nodule: Secondary | ICD-10-CM | POA: Diagnosis not present

## 2019-11-16 DIAGNOSIS — Z923 Personal history of irradiation: Secondary | ICD-10-CM | POA: Diagnosis not present

## 2019-11-16 DIAGNOSIS — Z853 Personal history of malignant neoplasm of breast: Secondary | ICD-10-CM | POA: Diagnosis not present

## 2019-11-16 DIAGNOSIS — N6310 Unspecified lump in the right breast, unspecified quadrant: Secondary | ICD-10-CM | POA: Diagnosis not present

## 2019-11-23 DIAGNOSIS — Z23 Encounter for immunization: Secondary | ICD-10-CM | POA: Diagnosis not present

## 2019-12-27 DIAGNOSIS — K5909 Other constipation: Secondary | ICD-10-CM | POA: Diagnosis not present

## 2019-12-27 DIAGNOSIS — R1030 Lower abdominal pain, unspecified: Secondary | ICD-10-CM | POA: Diagnosis not present

## 2019-12-27 DIAGNOSIS — D447 Neoplasm of uncertain behavior of aortic body and other paraganglia: Secondary | ICD-10-CM | POA: Diagnosis not present

## 2020-02-20 DIAGNOSIS — D0511 Intraductal carcinoma in situ of right breast: Secondary | ICD-10-CM | POA: Diagnosis not present

## 2020-02-20 DIAGNOSIS — R921 Mammographic calcification found on diagnostic imaging of breast: Secondary | ICD-10-CM | POA: Diagnosis not present

## 2020-02-20 DIAGNOSIS — R928 Other abnormal and inconclusive findings on diagnostic imaging of breast: Secondary | ICD-10-CM | POA: Diagnosis not present

## 2020-02-20 DIAGNOSIS — Z9011 Acquired absence of right breast and nipple: Secondary | ICD-10-CM | POA: Diagnosis not present

## 2020-02-20 DIAGNOSIS — Z923 Personal history of irradiation: Secondary | ICD-10-CM | POA: Diagnosis not present

## 2020-02-22 DIAGNOSIS — K5900 Constipation, unspecified: Secondary | ICD-10-CM | POA: Diagnosis not present

## 2020-02-22 DIAGNOSIS — R109 Unspecified abdominal pain: Secondary | ICD-10-CM | POA: Diagnosis not present

## 2020-04-20 DIAGNOSIS — N390 Urinary tract infection, site not specified: Secondary | ICD-10-CM | POA: Diagnosis not present

## 2020-05-07 DIAGNOSIS — D0512 Intraductal carcinoma in situ of left breast: Secondary | ICD-10-CM | POA: Diagnosis not present

## 2020-05-07 DIAGNOSIS — D0511 Intraductal carcinoma in situ of right breast: Secondary | ICD-10-CM | POA: Diagnosis not present

## 2020-05-07 DIAGNOSIS — M85859 Other specified disorders of bone density and structure, unspecified thigh: Secondary | ICD-10-CM | POA: Diagnosis not present

## 2020-05-07 DIAGNOSIS — C7A00 Malignant carcinoid tumor of unspecified site: Secondary | ICD-10-CM | POA: Diagnosis not present

## 2020-05-07 DIAGNOSIS — E038 Other specified hypothyroidism: Secondary | ICD-10-CM | POA: Diagnosis not present

## 2020-05-07 DIAGNOSIS — E063 Autoimmune thyroiditis: Secondary | ICD-10-CM | POA: Diagnosis not present

## 2020-05-07 DIAGNOSIS — Z789 Other specified health status: Secondary | ICD-10-CM | POA: Diagnosis not present

## 2020-05-08 DIAGNOSIS — H5203 Hypermetropia, bilateral: Secondary | ICD-10-CM | POA: Diagnosis not present

## 2020-05-08 DIAGNOSIS — H2513 Age-related nuclear cataract, bilateral: Secondary | ICD-10-CM | POA: Diagnosis not present

## 2020-05-08 DIAGNOSIS — H35372 Puckering of macula, left eye: Secondary | ICD-10-CM | POA: Diagnosis not present

## 2020-05-28 DIAGNOSIS — R1010 Upper abdominal pain, unspecified: Secondary | ICD-10-CM | POA: Diagnosis not present

## 2020-05-28 DIAGNOSIS — R110 Nausea: Secondary | ICD-10-CM | POA: Diagnosis not present

## 2020-06-04 ENCOUNTER — Encounter (INDEPENDENT_AMBULATORY_CARE_PROVIDER_SITE_OTHER): Payer: Medicare Other | Admitting: Ophthalmology

## 2020-06-27 DIAGNOSIS — Z23 Encounter for immunization: Secondary | ICD-10-CM | POA: Diagnosis not present

## 2020-07-02 ENCOUNTER — Ambulatory Visit (INDEPENDENT_AMBULATORY_CARE_PROVIDER_SITE_OTHER): Payer: Medicare Other | Admitting: Ophthalmology

## 2020-07-02 ENCOUNTER — Other Ambulatory Visit: Payer: Self-pay

## 2020-07-02 ENCOUNTER — Encounter (INDEPENDENT_AMBULATORY_CARE_PROVIDER_SITE_OTHER): Payer: Self-pay | Admitting: Ophthalmology

## 2020-07-02 DIAGNOSIS — H2513 Age-related nuclear cataract, bilateral: Secondary | ICD-10-CM

## 2020-07-02 DIAGNOSIS — H35372 Puckering of macula, left eye: Secondary | ICD-10-CM | POA: Insufficient documentation

## 2020-07-02 DIAGNOSIS — H43813 Vitreous degeneration, bilateral: Secondary | ICD-10-CM | POA: Insufficient documentation

## 2020-07-02 NOTE — Assessment & Plan Note (Signed)
The nature of macular pucker (epiretinal membrane ERM) was discussed with the patient as well as threshold criteria for vitrectomy surgery. I explained that in rare cases another surgery is needed to actually remove a second wrinkle should it regrow.  Most often, the epiretinal membrane and underlying wrinkled internal limiting membrane are removed with the first surgery, to accomplish the goals.     If the operative eye is Phakic (natural lens still present), cataract surgery is often recommended prior to Vitrectomy. This will enable the retina surgeon to have the best view during surgery and the patient to obtain optimal results in the future. Treatment options were discussed. The OCT was displayed and reviewed with the patient.

## 2020-07-02 NOTE — Progress Notes (Signed)
07/02/2020     CHIEF COMPLAINT Patient presents for Blurred Vision   HISTORY OF PRESENT ILLNESS: Suzanne Bishop is a 69 y.o. female who presents to the clinic today for:   HPI    Blurred Vision    In both eyes.  Onset was gradual.  Vision is blurred.  Severity is mild.  Occurring constantly.  It is worse throughout the day.  Since onset it is stable.  Treatments tried include no treatments.  Response to treatment was no improvement.  I, the attending physician,  performed the HPI with the patient and updated documentation appropriately.          Comments    Pt referred by Dr. Phillip Heal for ERM OS  Pt has noticed a gradual decrease in overall vision over the past year. Pt has not seen any FOL or floaters in over a year.        Last edited by Tilda Franco on 07/02/2020 10:52 AM. (History)      Referring physician: Velna Hatchet, MD [place],  [place] [id number]  HISTORICAL INFORMATION:   Selected notes from the [facility]: No current outpatient medications on file. (Ophthalmic Drugs)   No current facility-administered medications for this visit. (Ophthalmic Drugs)   Current Outpatient Medications (Other)  Medication Sig  . famotidine (PEPCID) 10 MG tablet Take 10 mg by mouth 2 (two) times daily.  . Ascorbic Acid (VITAMIN C) 100 MG tablet Take 100 mg by mouth daily.  . calcium carbonate (OS-CAL - DOSED IN MG OF ELEMENTAL CALCIUM) 1250 (500 CA) MG tablet Take 600 mg by mouth.   . finasteride (PROSCAR) 5 MG tablet   . fluticasone (FLONASE) 50 MCG/ACT nasal spray Place into the nose.  . levocetirizine (XYZAL) 5 MG tablet TK 1 T PO QHS  . metroNIDAZOLE (METROGEL) 0.75 % gel Apply topically 2 (two) times daily.  . Minoxidil (ROGAINE MENS) 5 % FOAM Apply topically.  . Multiple Vitamin (MULTIVITAMIN) tablet Take 1 tablet by mouth daily.  Marland Kitchen omega-3 acid ethyl esters (LOVAZA) 1 g capsule Take by mouth 2 (two) times  daily.  . ranitidine (ZANTAC) 150 MG capsule Take 150 mg by mouth 2 (two) times daily.  Marland Kitchen SYNTHROID 100 MCG tablet   . SYNTHROID 112 MCG tablet Take 100 mcg by mouth daily.   Marland Kitchen VITAMIN D, CHOLECALCIFEROL, PO Take 400 Int'l Units by mouth daily.  Marland Kitchen zinc gluconate 50 MG tablet Take by mouth.   No current facility-administered medications for this visit. (Other)      REVIEW OF SYSTEMS:    ALLERGIES Allergies  Allergen Reactions  . Statins Other (See Comments)    Muscles sore  . Sulfa Antibiotics Hives    PAST MEDICAL HISTORY Past Medical History:  Diagnosis Date  . Allergy   . Androgenic alopecia   . Asthma   . Cancer ([place])   . Osteopenia   . Pheochromocytoma of right adrenal gland    2016  . Thyroid disease    hypothyroid   Past Surgical History:  Procedure Laterality Date  . ABDOMINAL HYSTERECTOMY    . CESAREAN SECTION    . RHINOPLASTY    . TONSILLECTOMY    . TUBAL LIGATION    . WISDOM TOOTH EXTRACTION      FAMILY HISTORY Family History  Problem Relation Age of Onset  . Heart disease Mother   . Osteoporosis Mother   . Cancer  Father        prostate  . Heart disease Maternal Grandmother   . Heart disease Maternal Grandfather   . Cancer Paternal Grandmother        ? unknown type    SOCIAL HISTORY Social History   Tobacco Use  . Smoking status: Former Smoker    Years: 8.00    Quit date: 06/09/2014    Years since quitting: 6.0  . Smokeless tobacco: Former Systems developer  . Tobacco comment: 5-10 cigarettes daily and chews nicotine gum   Substance Use Topics  . Alcohol use: No    Alcohol/week: 3.0 - 5.0 standard drinks    Types: 3 - 5 Standard drinks or equivalent per week  . Drug use: No         OPHTHALMIC EXAM:  Base Eye Exam    Visual Acuity (Snellen - Linear)      Right Left   Dist cc 20/30 -2 20/50 -2   Dist ph cc 20/25 -1 NI       Tonometry (Tonopen, 10:58 AM)      Right Left   Pressure 9 11       Pupils      Pupils Dark Light Shape  React APD   Right PERRL 3 2 Round Brisk None   Left PERRL 3 2 Round 4 None       Visual Fields (Counting fingers)      Left Right    Full Full       Neuro/Psych    Oriented x3: Yes   Mood/Affect: Normal       Dilation    Both eyes: 1.0% Mydriacyl, 2.5% Phenylephrine @ 10:58 AM        Slit Lamp and Fundus Exam    External Exam      Right Left   External Normal Normal       Slit Lamp Exam      Right Left   Lids/Lashes Normal Normal   Conjunctiva/Sclera White and quiet White and quiet   Cornea Clear Clear   Anterior Chamber Deep and quiet Deep and quiet   Iris Round and reactive Round and reactive   Lens Nuclear sclerosis 2.5+ Nuclear sclerosis 2.5+   Anterior Vitreous Normal Normal       Fundus Exam      Right Left   Posterior Vitreous Posterior vitreous detachment Posterior vitreous detachment   Disc Normal Normal   C/D Ratio 0.15 0.1   Macula Normal Epiretinal membrane, severe topo distortion   Vessels Normal Normal   Periphery Normal Normal          IMAGING AND PROCEDURES  Imaging and Procedures for 07/02/20  OCT, Retina - OU - Both Eyes       Right Eye Quality was good. Scan locations included subfoveal. Central Foveal Thickness: 291. Findings include normal foveal contour.   Left Eye Quality was good. Scan locations included subfoveal. Central Foveal Thickness: 515. Findings include epiretinal membrane, abnormal foveal contour.   Notes OS with very severe epiretinal membrane macular topographic distortion and retinal thickening, foveal distortion  As well as perifoveal early retinal schisis.                  ASSESSMENT/PLAN:  Posterior vitreous detachment of both eyes   The nature of posterior vitreous detachment was discussed with the patient as well as its physiology, its age prevalence, and its possible implication regarding retinal breaks and detachment.  An informational brochure was given  to the patient.  All the patient's  questions were answered.  The patient was asked to return if new or different flashes or floaters develops.   Patient was instructed to contact office immediately if any changes were noticed. I explained to the patient that vitreous inside the eye is similar to jello inside a bowl. As the jello melts it can start to pull away from the bowl, similarly the vitreous throughout our lives can begin to pull away from the retina. That process is called a posterior vitreous detachment. In some cases, the vitreous can tug hard enough on the retina to form a retinal tear. I discussed with the patient the signs and symptoms of a retinal detachment.  Do not rub the eye.  Nuclear sclerotic cataract of both eyes The nature of cataract was discussed with the patient as well as the elective nature of surgery. The patient was reassured that surgery at a later date does not put the patient at risk for a worse outcome. It was emphasized that the need for surgery is dictated by the patient's quality of life as influenced by the cataract. Patient was instructed to maintain close follow up with their general eye care doctor.  Cataract(s)  May account for the patient's complaint OS, though not likely.. I discussed the risks and benefits of cataract surgery. Options were explained to the patient. The patient understands that new glasses may not improve their vision and desires to have cataract surgery. I have recommended follow up with their general eye care doctor for evaluation and consideration of cataract extraction with new intraocular lens insertion.  OS with nuclear sclerotic cataract that would hamper any attempt at surgical correction of the severe topographic distortion present in the left eye.  I will explained to the patient that cataract extraction with intraocular lens placement in the left eye would be the initial step to determine the degree to which her visual acuity dysfunction in the left eye is from the  epiretinal membrane (most likely).  Moreover cataract extraction with insertion of intraocular lens would clarify the media to allow the best visual acuity result possible with a vitrectomy and membrane peel (the ILM).  Macular puckering of retina, left eye The nature of macular pucker (epiretinal membrane ERM) was discussed with the patient as well as threshold criteria for vitrectomy surgery. I explained that in rare cases another surgery is needed to actually remove a second wrinkle should it regrow.  Most often, the epiretinal membrane and underlying wrinkled internal limiting membrane are removed with the first surgery, to accomplish the goals.     If the operative eye is [place] (natural lens still present), cataract surgery is often recommended prior to Vitrectomy. This will enable the retina surgeon to have the best view during surgery and the patient to obtain optimal results in the future. Treatment options were discussed. The OCT was displayed and reviewed with the patient.      ICD-10-CM   1. Macular puckering of retina, left eye  H35.372 OCT, Retina - OU - Both Eyes  2. Posterior vitreous detachment of both eyes  H43.813   3. Nuclear sclerotic cataract of both eyes  H25.13     1. Risk and benefits of surgical intervention undertaken with the patient. I explained that the retina with significant topographic distortion will begin to decline and its ability to recover based upon the rather massive retinal thickening in the foveal and its decremental changes as signified by retina schisis temporal to  the fovea.  2. I explained the surgical maneuvers undertaken to correct this problem and to allow for the retina to resume a more normal configuration. I carefully explained that the normal foveal depression will never return in an eye with this much distortion.  3. I use the sunglasses analogy to describe the effect of the cataract in the left eye and its impact on microscopic approach  surgically to remove the internal limiting membrane triggering the macular topographic distortion. I explained the critical need for cataract traction with intraocular lens placement of a nonmultifocal IOL under the direction of Dr. Katy Apo initially followed soon, usually 1 to 3 weeks thereafter, with vitrectomy and membrane peel  4. Will direct the patient to return visit with Dr. Katy Apo soon should she want to undergo the steps to initiate improvement in healing in this macular condition  Ophthalmic Meds Ordered this visit:  No orders of the defined types were placed in this encounter.      Return ,, I recommend a nonmultifocal IOL,,, for Patient asked to return to see Dr. Katy Apo for preparation of cataract surgery left eye.  Patient Instructions  Macular Pucker  A macular pucker is scarring on the macula of the eye. The macula is the part of the eye that lets you see clearly and sharply. It also lets you see detail. The macula is in the middle of the thin membrane that covers the back of your eye (retina). A macular pucker usually starts in one eye but can affect both eyes over time. This condition can affect your vision. Vision changes can range from mild to severe. These vision changes usually do not get worse over time. What are the causes? This condition is caused by shrinking of the gel-like fluid that fills your eye (vitreous). The vitreous may shrink as you get older. As it shrinks, it pulls on your macula and forms scar tissue. What increases the risk? You are more likely to develop this condition if you are age 73 or older. Certain medical conditions can also increase your risk of macular pucker. These include:  A disease of the retina (retinopathy).  Injury or trauma to the eye.  Nearsightedness (myopia).  Tearing or detachment of the retina.  Inflammation of the eye (uveitis).  Diabetes. What are the signs or symptoms? Symptoms of this condition  include:  Blurred vision. You may have trouble reading small print or seeing fine details.  Distorted vision. Straight lines may appear wavy.  A gray area or blind spot in the center of your vision. A mild pucker may cause no symptoms. How is this diagnosed? This condition is diagnosed based on your medical history and a physical exam. You may be referred to a health care provider who specializes in eye conditions (ophthalmologist) for an eye exam. You may also have tests, including tests that involve:  Checking the back of your eye with a microscope.  Injecting dye into the back of your eye before doing an exam.  Taking pictures of the back of your eye. How is this treated? Treatment is not needed for this condition unless your symptoms interfere with your daily activities. Your eye care provider may change your prescription if you wear eyeglasses to help you see better. If treatment is needed, surgery is the only treatment for macular pucker.  This surgery (vitrectomy) is done only when macular pucker causes vision changes that interfere with your daily activities.  In this procedure, a surgeon removes any  scar tissue and replaces your vitreous with a saltwater solution. Follow these instructions at home:  Be aware of any changes in your vision.  Keep all follow-up visits as told by your health care provider. This is important. Contact a health care provider if:  You have any new or worsening vision changes. Summary  A macular pucker is scarring on the macula of the eye.  This condition is caused by shrinking of the gel-like fluid that fills your eye.  A macular pucker can affect your vision. You may have trouble reading small print or seeing fine details.  Treatment is not needed unless the condition interferes with your daily activities. This information is not intended to replace advice given to you by your health care provider. Make sure you discuss any questions you  have with your health care provider. Document Revised: 02/21/2018 Document Reviewed: 02/21/2018 Elsevier Patient Education  [place] the diagnoses, plan, and follow up with the patient and they expressed understanding.  Patient expressed understanding of the importance of proper follow up care.   Clent Demark Degan Hanser M.D. Diseases & Surgery of the Retina and Vitreous [facility] 07/02/20     Abbreviations: M myopia (nearsighted); A astigmatism; H hyperopia (farsighted); P presbyopia; Mrx spectacle prescription;  CTL contact lenses; OD right eye; OS left eye; OU both eyes  XT exotropia; ET esotropia; PEK punctate epithelial keratitis; PEE punctate epithelial erosions; DES dry eye syndrome; MGD meibomian gland dysfunction; ATs artificial tears; PFAT's preservative free artificial tears; [place] nuclear sclerotic cataract; PSC posterior subcapsular cataract; ERM epi-retinal membrane; PVD posterior vitreous detachment; RD retinal detachment; DM diabetes mellitus; DR diabetic retinopathy; NPDR non-proliferative diabetic retinopathy; PDR proliferative diabetic retinopathy; CSME clinically significant macular edema; DME diabetic macular edema; dbh dot blot hemorrhages; CWS cotton wool spot; POAG primary open angle glaucoma; C/D cup-to-disc ratio; HVF humphrey visual field; GVF goldmann visual field; OCT optical coherence tomography; IOP intraocular pressure; BRVO Branch retinal vein occlusion; CRVO central retinal vein occlusion; CRAO central retinal artery occlusion; BRAO branch retinal artery occlusion; RT retinal tear; SB scleral buckle; PPV pars plana vitrectomy; VH Vitreous hemorrhage; PRP panretinal laser photocoagulation; IVK intravitreal kenalog; VMT vitreomacular traction; MH Macular hole;  NVD neovascularization of the disc; NVE neovascularization elsewhere; AREDS age related eye disease study; ARMD age related macular degeneration; POAG primary open angle  glaucoma; EBMD epithelial/anterior basement membrane dystrophy; ACIOL anterior chamber intraocular lens; IOL intraocular lens; PCIOL posterior chamber intraocular lens; Phaco/IOL phacoemulsification with intraocular lens placement; [place] photorefractive keratectomy; LASIK laser assisted in situ keratomileusis; HTN hypertension; DM diabetes mellitus; COPD chronic obstructive pulmonary disease

## 2020-07-02 NOTE — Patient Instructions (Signed)
Macular Pucker  A macular pucker is scarring on the macula of the eye. The macula is the part of the eye that lets you see clearly and sharply. It also lets you see detail. The macula is in the middle of the thin membrane that covers the back of your eye (retina). A macular pucker usually starts in one eye but can affect both eyes over time. This condition can affect your vision. Vision changes can range from mild to severe. These vision changes usually do not get worse over time. What are the causes? This condition is caused by shrinking of the gel-like fluid that fills your eye (vitreous). The vitreous may shrink as you get older. As it shrinks, it pulls on your macula and forms scar tissue. What increases the risk? You are more likely to develop this condition if you are age 67 or older. Certain medical conditions can also increase your risk of macular pucker. These include:  A disease of the retina (retinopathy).  Injury or trauma to the eye.  Nearsightedness (myopia).  Tearing or detachment of the retina.  Inflammation of the eye (uveitis).  Diabetes. What are the signs or symptoms? Symptoms of this condition include:  Blurred vision. You may have trouble reading small print or seeing fine details.  Distorted vision. Straight lines may appear wavy.  A gray area or blind spot in the center of your vision. A mild pucker may cause no symptoms. How is this diagnosed? This condition is diagnosed based on your medical history and a physical exam. You may be referred to a health care provider who specializes in eye conditions (ophthalmologist) for an eye exam. You may also have tests, including tests that involve:  Checking the back of your eye with a microscope.  Injecting dye into the back of your eye before doing an exam.  Taking pictures of the back of your eye. How is this treated? Treatment is not needed for this condition unless your symptoms interfere with your daily  activities. Your eye care provider may change your prescription if you wear eyeglasses to help you see better. If treatment is needed, surgery is the only treatment for macular pucker.  This surgery (vitrectomy) is done only when macular pucker causes vision changes that interfere with your daily activities.  In this procedure, a surgeon removes any scar tissue and replaces your vitreous with a saltwater solution. Follow these instructions at home:  Be aware of any changes in your vision.  Keep all follow-up visits as told by your health care provider. This is important. Contact a health care provider if:  You have any new or worsening vision changes. Summary  A macular pucker is scarring on the macula of the eye.  This condition is caused by shrinking of the gel-like fluid that fills your eye.  A macular pucker can affect your vision. You may have trouble reading small print or seeing fine details.  Treatment is not needed unless the condition interferes with your daily activities. This information is not intended to replace advice given to you by your health care provider. Make sure you discuss any questions you have with your health care provider. Document Revised: 02/21/2018 Document Reviewed: 02/21/2018 Elsevier Patient Education  [place].

## 2020-07-02 NOTE — Assessment & Plan Note (Signed)
The nature of cataract was discussed with the patient as well as the elective nature of surgery. The patient was reassured that surgery at a later date does not put the patient at risk for a worse outcome. It was emphasized that the need for surgery is dictated by the patient's quality of life as influenced by the cataract. Patient was instructed to maintain close follow up with their general eye care doctor.  Cataract(s)  May account for the patient's complaint OS, though not likely.. I discussed the risks and benefits of cataract surgery. Options were explained to the patient. The patient understands that new glasses may not improve their vision and desires to have cataract surgery. I have recommended follow up with their general eye care doctor for evaluation and consideration of cataract extraction with new intraocular lens insertion.  OS with nuclear sclerotic cataract that would hamper any attempt at surgical correction of the severe topographic distortion present in the left eye.  I will explained to the patient that cataract extraction with intraocular lens placement in the left eye would be the initial step to determine the degree to which her visual acuity dysfunction in the left eye is from the epiretinal membrane (most likely).  Moreover cataract extraction with insertion of intraocular lens would clarify the media to allow the best visual acuity result possible with a vitrectomy and membrane peel (the ILM).

## 2020-07-02 NOTE — Assessment & Plan Note (Signed)

## 2020-08-11 DIAGNOSIS — R319 Hematuria, unspecified: Secondary | ICD-10-CM | POA: Diagnosis not present

## 2020-08-11 DIAGNOSIS — N281 Cyst of kidney, acquired: Secondary | ICD-10-CM | POA: Diagnosis not present

## 2020-08-11 DIAGNOSIS — R3129 Other microscopic hematuria: Secondary | ICD-10-CM | POA: Diagnosis not present

## 2020-08-27 DIAGNOSIS — R928 Other abnormal and inconclusive findings on diagnostic imaging of breast: Secondary | ICD-10-CM | POA: Diagnosis not present

## 2020-08-27 DIAGNOSIS — R921 Mammographic calcification found on diagnostic imaging of breast: Secondary | ICD-10-CM | POA: Diagnosis not present

## 2020-08-27 DIAGNOSIS — D0511 Intraductal carcinoma in situ of right breast: Secondary | ICD-10-CM | POA: Diagnosis not present

## 2020-08-27 DIAGNOSIS — Z9889 Other specified postprocedural states: Secondary | ICD-10-CM | POA: Diagnosis not present

## 2020-09-14 IMAGING — CT CT ABD-PELV W/O CM
1 of 2 series · 10 of 32 positions shown, 16 images · non-contrast
Comparison: 06/26/2015.

CLINICAL DATA: Flank pain and hematuria. History of right adrenal
gland pheochromocytoma. Ex-smoker.

EXAM:
CT ABDOMEN AND PELVIS WITHOUT CONTRAST
TECHNIQUE: Multidetector CT imaging of the abdomen and pelvis was performed
following the standard protocol without IV contrast.

[Series 3: lung · axial · 0.57mm/px · z∈[-129,-61]mm · 10 of 42 slices shown, 16 images]
[im 4/42  soft-tissue]
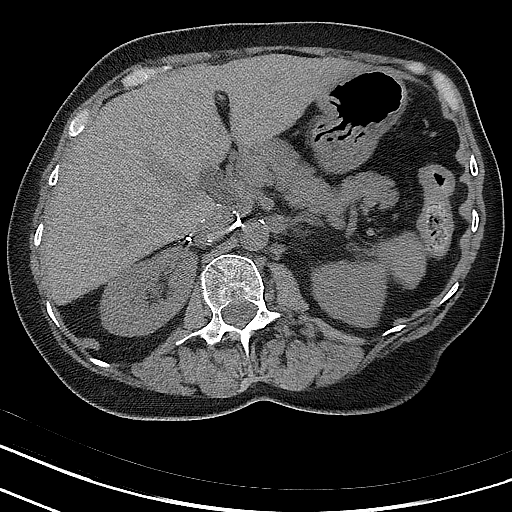
[im 4/42  bone]
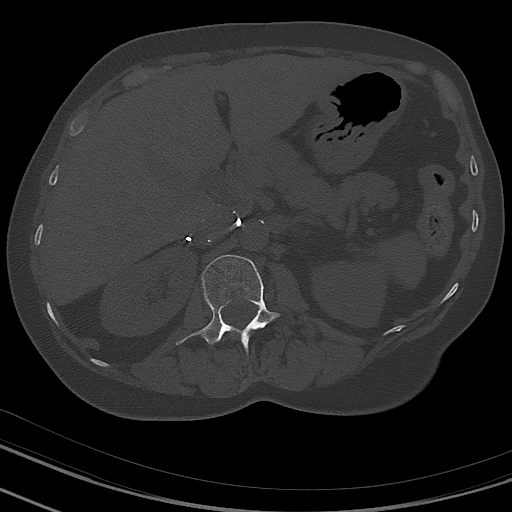
[im 8/42  soft-tissue]
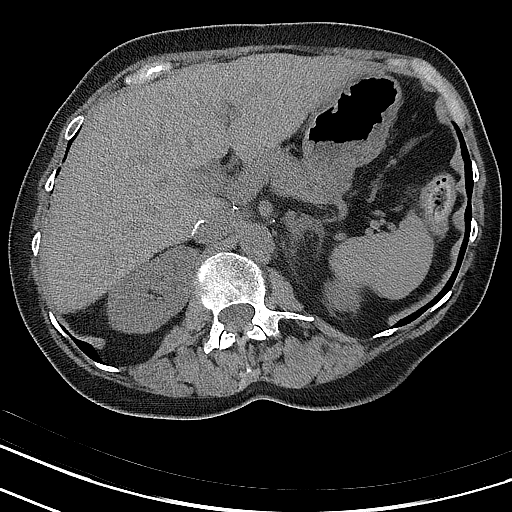
[im 12/42  soft-tissue]
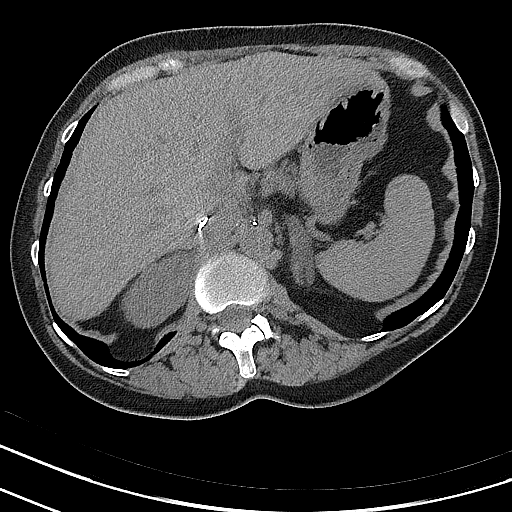
[im 15/42  soft-tissue]
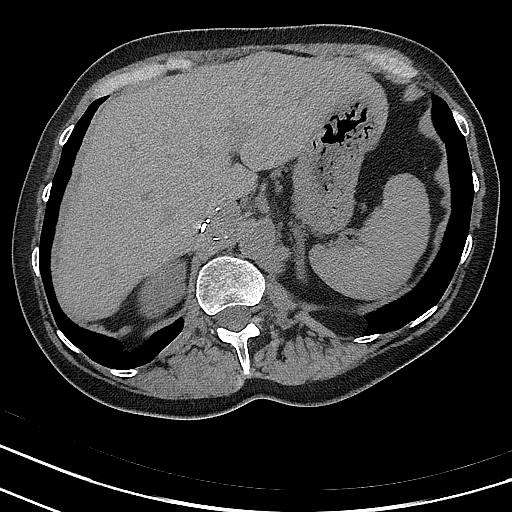
[im 19/42  soft-tissue]
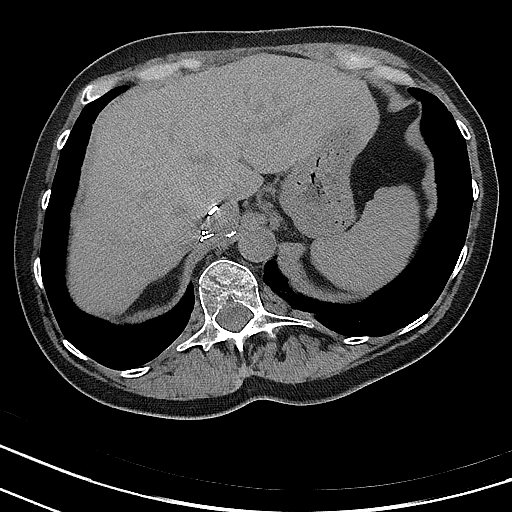
[im 23/42  soft-tissue]
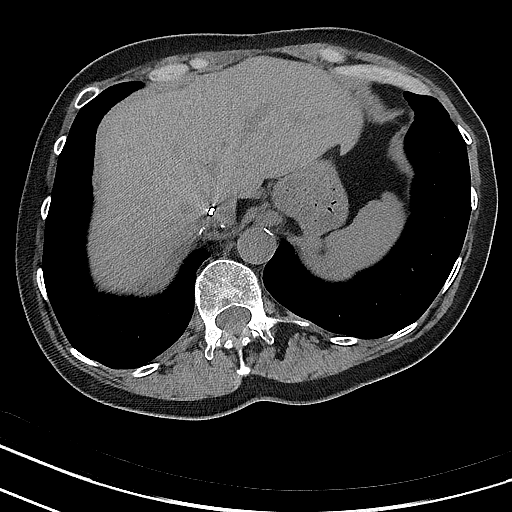
[im 27/42  soft-tissue]
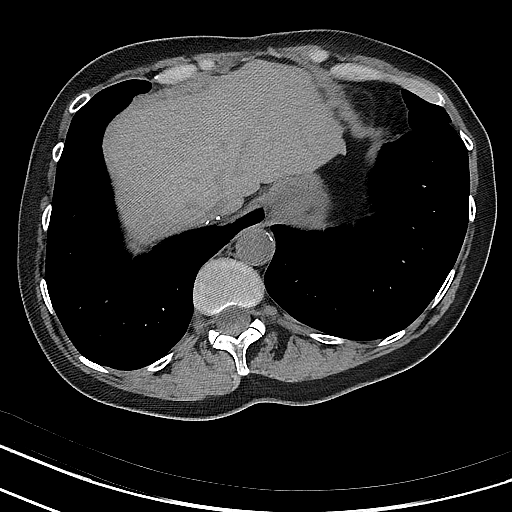
[im 27/42  lung]
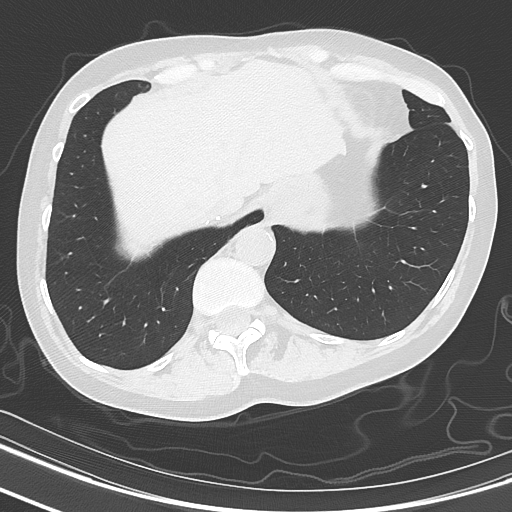
[im 30/42  soft-tissue]
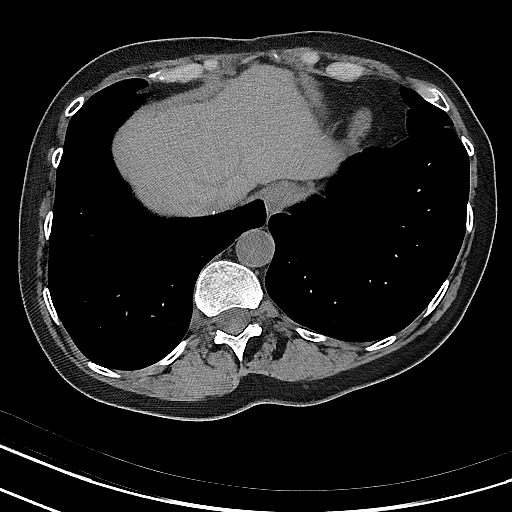
[im 30/42  lung]
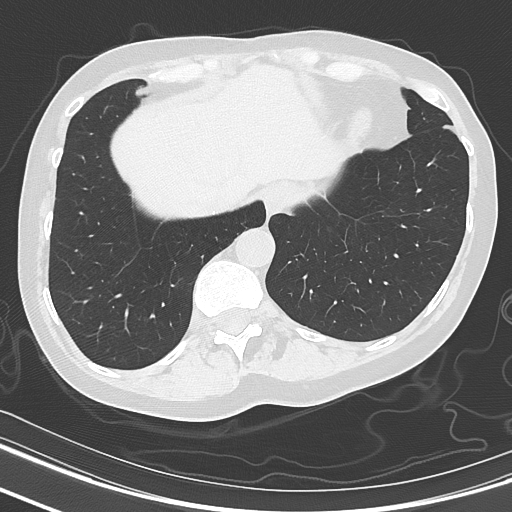
[im 34/42  soft-tissue]
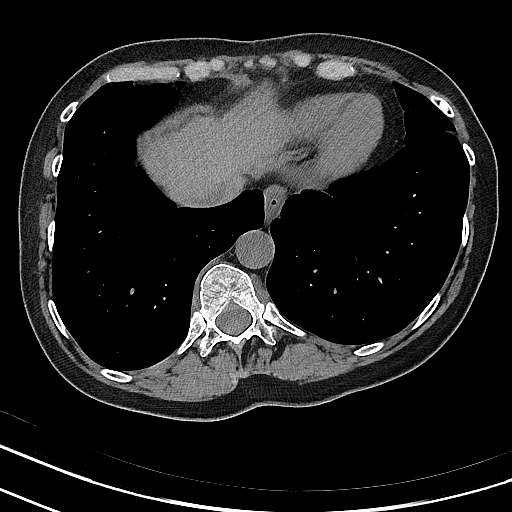
[im 34/42  lung]
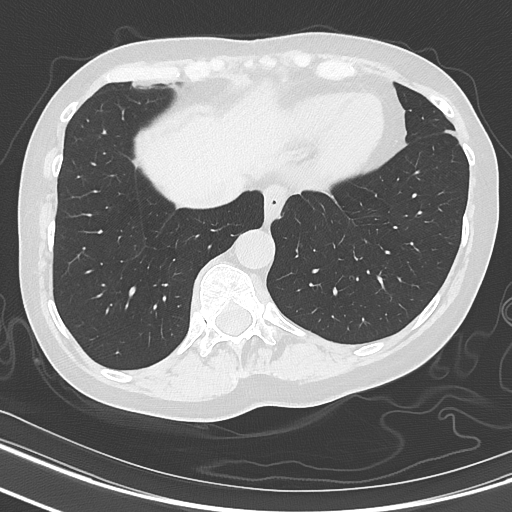
[im 34/42  bone]
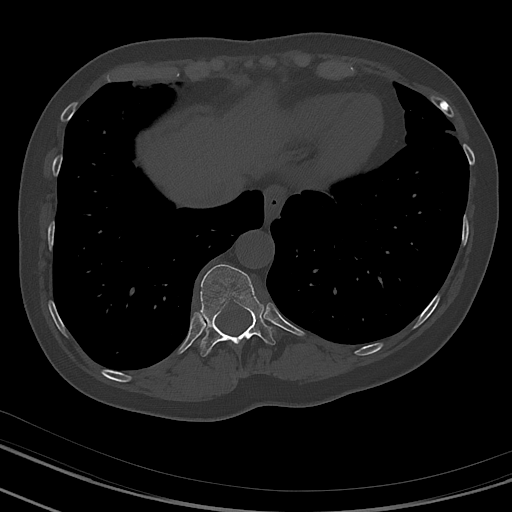
[im 38/42  soft-tissue]
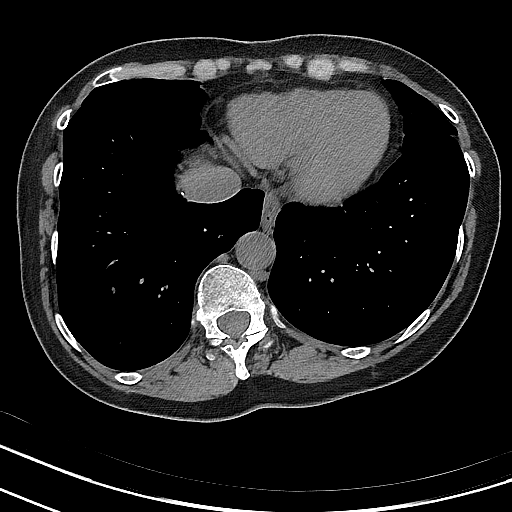
[im 38/42  lung]
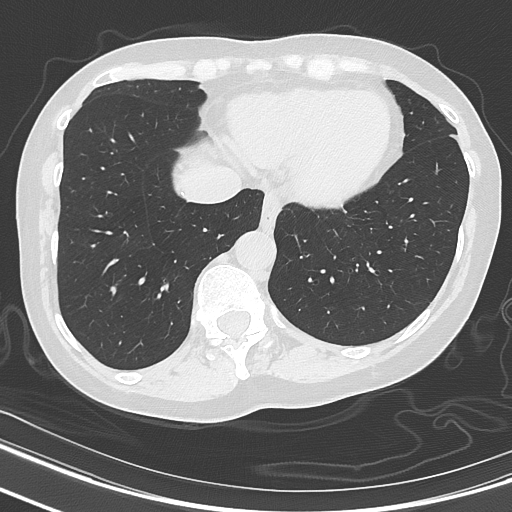

[10 of 32 positions shown; findings below may reference images not displayed]

FINDINGS: Lower chest: The lungs are hyperexpanded and clear the lung bases.

Hepatobiliary: Probable small noncalcified gallstones in the
dependent portion of the gallbladder. Mild gallbladder wall
thickening without pericholecystic fluid. Unremarkable liver.

Pancreas: Unremarkable. No pancreatic ductal dilatation or
surrounding inflammatory changes.

Spleen: Normal in size without focal abnormality.

Adrenals/Urinary Tract: Surgically absent right adrenal gland and
previously demonstrated mass with associated surgical clips. Normal
appearing left adrenal gland. 4 mm mid right renal calculus. Stable
previously demonstrated left renal cyst. No bladder or ureteral
calculi and no hydronephrosis.

Stomach/Bowel: Unremarkable stomach, small bowel and colon. No
evidence of appendicitis.

Vascular/Lymphatic: No significant vascular findings are present. No
enlarged abdominal or pelvic lymph nodes.

Reproductive: Status post hysterectomy. No adnexal masses.

Other: No abdominal wall hernia or abnormality. No abdominopelvic
ascites.

Musculoskeletal: L5-S1 degenerative changes. Mild to moderate
thoracolumbar scoliosis.
IMPRESSION: 1. No acute abnormality.
2. Probable small noncalcified gallstones in the dependent portion
of the gallbladder.
3. Mild gallbladder wall thickening without pericholecystic fluid.
This could be due to mild acute or chronic cholecystitis. This could
be further evaluated with right upper quadrant ultrasound.
4. 4 mm nonobstructing mid right renal calculus.
5. Changes of COPD at the lung bases.

## 2020-09-23 DIAGNOSIS — M810 Age-related osteoporosis without current pathological fracture: Secondary | ICD-10-CM | POA: Diagnosis not present

## 2020-09-23 DIAGNOSIS — E063 Autoimmune thyroiditis: Secondary | ICD-10-CM | POA: Diagnosis not present

## 2020-09-23 DIAGNOSIS — E785 Hyperlipidemia, unspecified: Secondary | ICD-10-CM | POA: Diagnosis not present

## 2020-10-01 DIAGNOSIS — R82998 Other abnormal findings in urine: Secondary | ICD-10-CM | POA: Diagnosis not present

## 2020-10-01 DIAGNOSIS — Z1212 Encounter for screening for malignant neoplasm of rectum: Secondary | ICD-10-CM | POA: Diagnosis not present

## 2020-10-02 DIAGNOSIS — Z20828 Contact with and (suspected) exposure to other viral communicable diseases: Secondary | ICD-10-CM | POA: Diagnosis not present

## 2020-10-02 DIAGNOSIS — J019 Acute sinusitis, unspecified: Secondary | ICD-10-CM | POA: Diagnosis not present

## 2020-10-07 DIAGNOSIS — J329 Chronic sinusitis, unspecified: Secondary | ICD-10-CM | POA: Diagnosis not present

## 2020-10-07 DIAGNOSIS — E785 Hyperlipidemia, unspecified: Secondary | ICD-10-CM | POA: Diagnosis not present

## 2020-10-07 DIAGNOSIS — K219 Gastro-esophageal reflux disease without esophagitis: Secondary | ICD-10-CM | POA: Diagnosis not present

## 2020-10-07 DIAGNOSIS — Z0000 Encounter for general adult medical examination without abnormal findings: Secondary | ICD-10-CM | POA: Diagnosis not present

## 2020-10-07 DIAGNOSIS — M1990 Unspecified osteoarthritis, unspecified site: Secondary | ICD-10-CM | POA: Diagnosis not present

## 2020-10-07 DIAGNOSIS — M810 Age-related osteoporosis without current pathological fracture: Secondary | ICD-10-CM | POA: Diagnosis not present

## 2020-10-07 DIAGNOSIS — Z853 Personal history of malignant neoplasm of breast: Secondary | ICD-10-CM | POA: Diagnosis not present

## 2020-10-15 IMAGING — US US ABDOMEN LIMITED
1 series · 14 of 25 positions shown · non-contrast
Comparison: CT scan dated 08/19/2018

CLINICAL DATA: Nausea. Possible gallstone seen on CT scan dated
08/19/2018

EXAM:
ULTRASOUND ABDOMEN LIMITED RIGHT UPPER QUADRANT

[Series 1: us abdomen limited · 0.15mm/px · 14 of 52 slices shown]
[im 1/52]
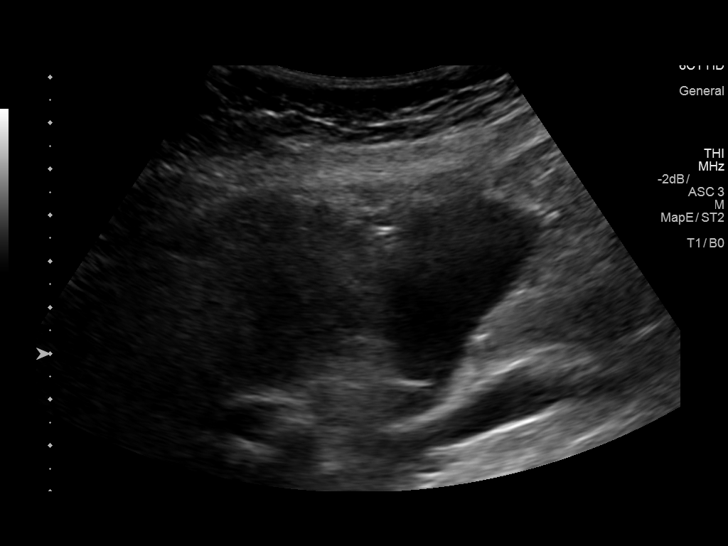
[im 5/52]
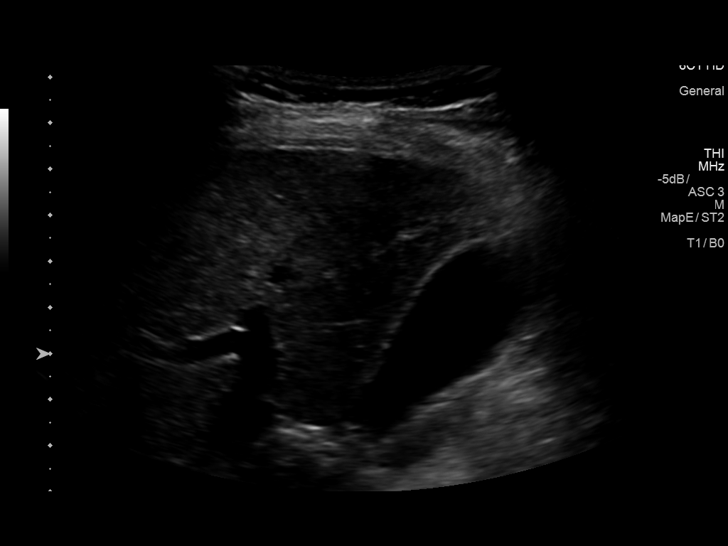
[im 9/52]
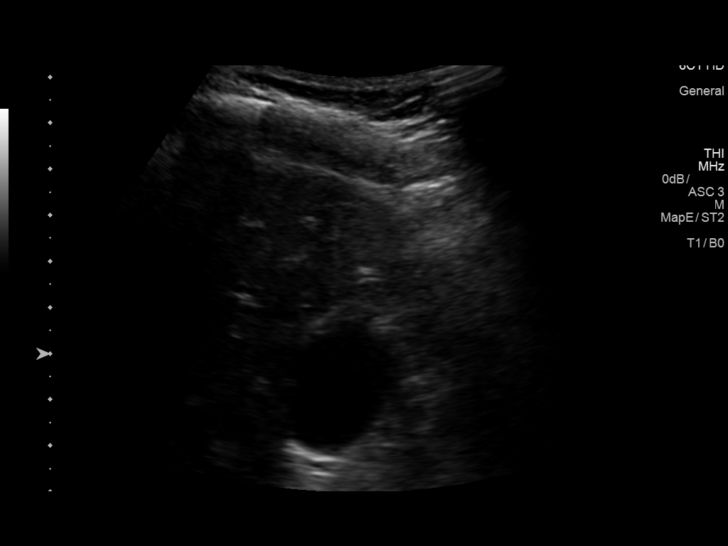
[im 13/52]
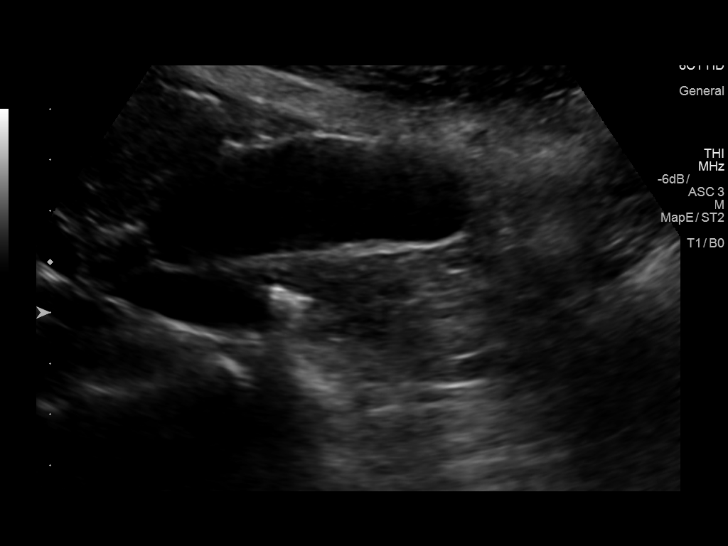
[im 18/52]
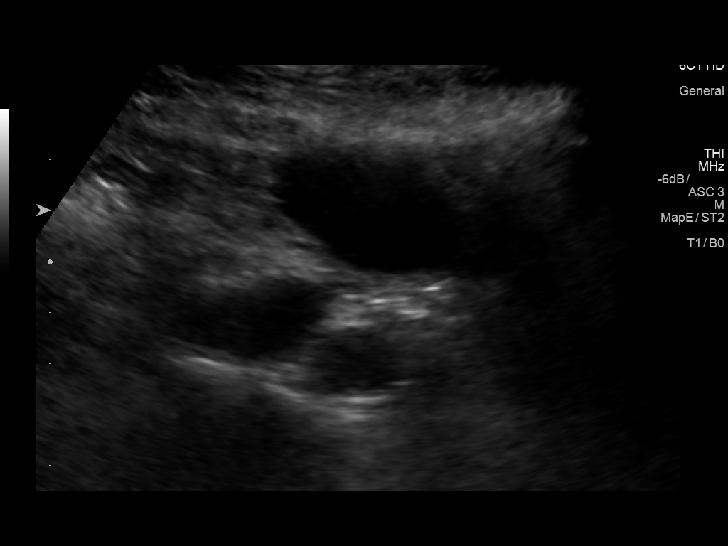
[im 20/52]
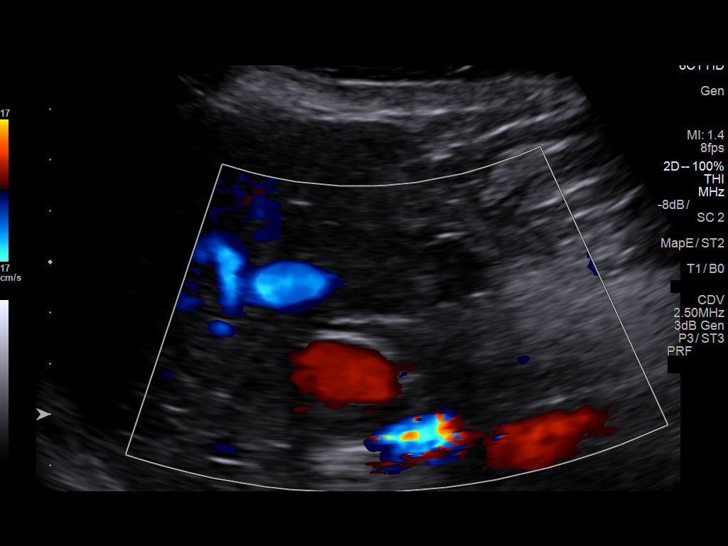
[im 24/52]
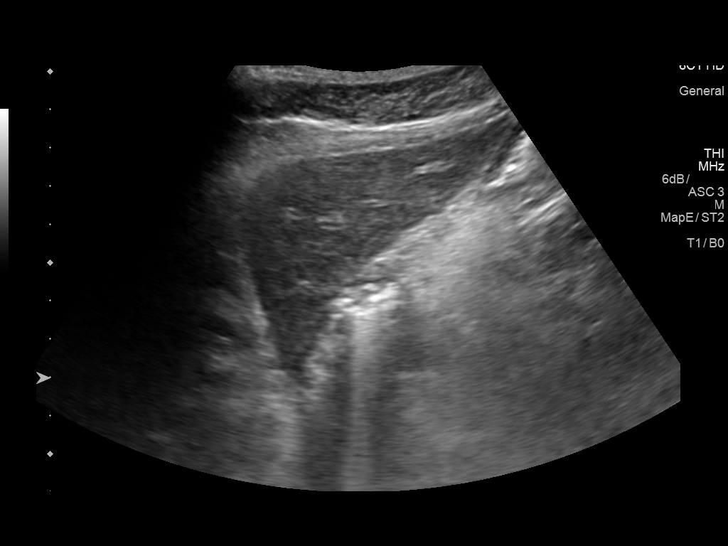
[im 28/52]
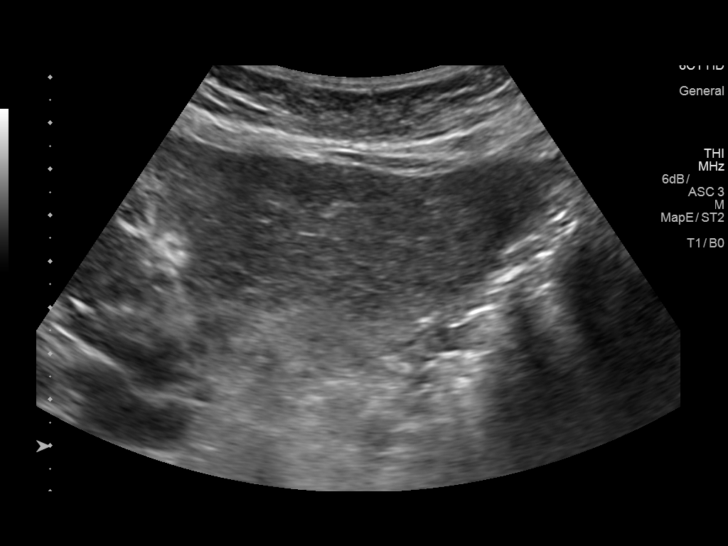
[im 32/52]
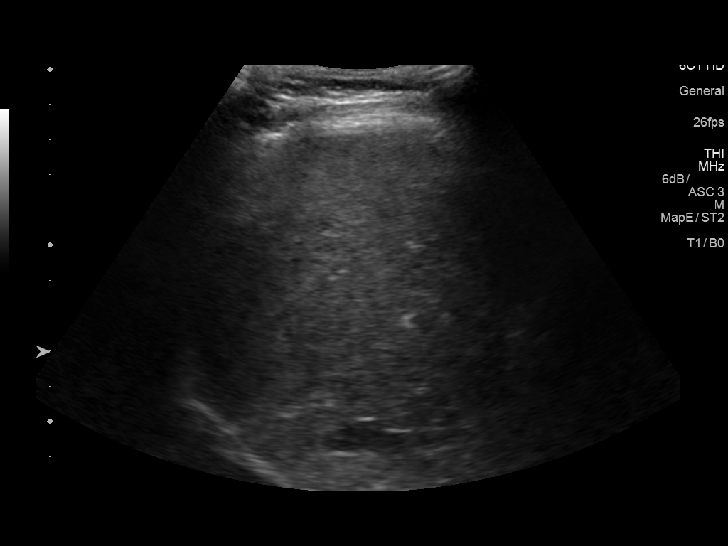
[im 35/52]
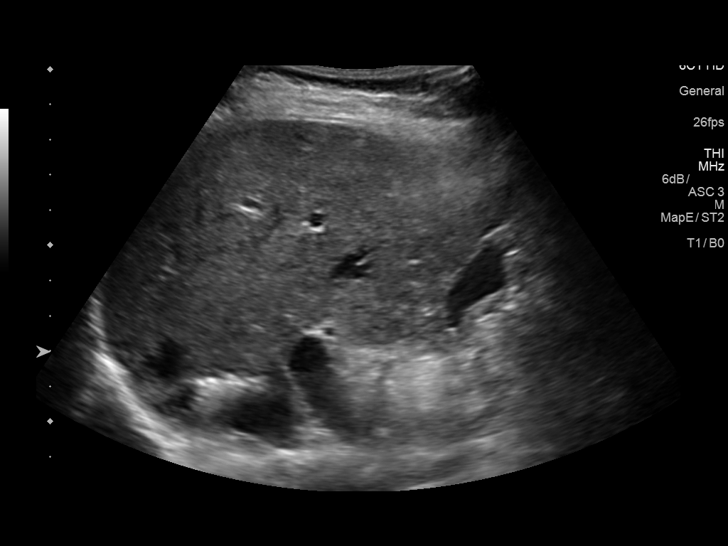
[im 39/52]
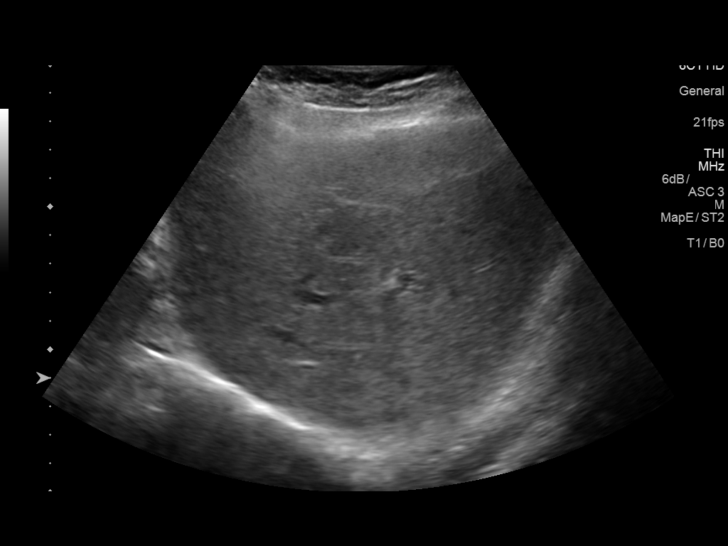
[im 43/52]
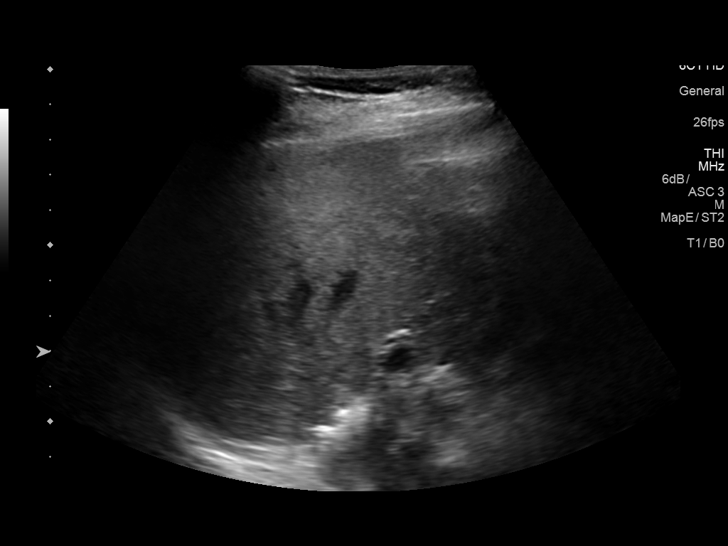
[im 47/52]
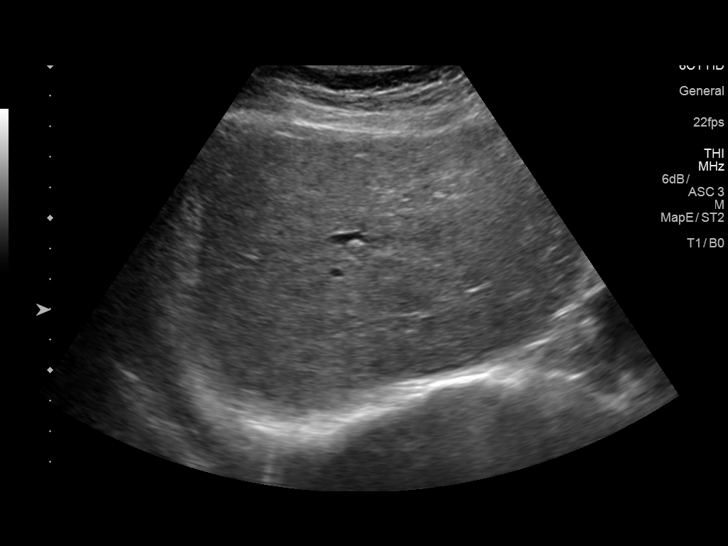
[im 52/52]
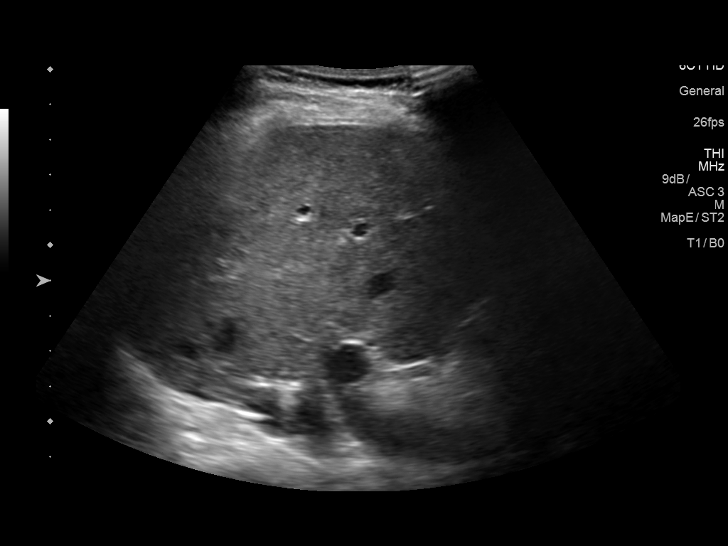

[14 of 25 positions shown; findings below may reference images not displayed]

FINDINGS: Gallbladder:

No gallstones or wall thickening visualized. No sonographic Murphy
sign noted by sonographer.

Common bile duct:

Diameter: 3.1 mm, normal.

Liver:

No focal lesion identified. Within normal limits in parenchymal
echogenicity. Portal vein is patent on color Doppler imaging with
normal direction of blood flow towards the liver.
IMPRESSION: Normal exam.  Specifically, no evidence of gallstones.

## 2020-11-06 DIAGNOSIS — R978 Other abnormal tumor markers: Secondary | ICD-10-CM | POA: Diagnosis not present

## 2020-11-06 DIAGNOSIS — D447 Neoplasm of uncertain behavior of aortic body and other paraganglia: Secondary | ICD-10-CM | POA: Diagnosis not present

## 2020-11-06 DIAGNOSIS — Z9889 Other specified postprocedural states: Secondary | ICD-10-CM | POA: Diagnosis not present

## 2020-11-06 DIAGNOSIS — R911 Solitary pulmonary nodule: Secondary | ICD-10-CM | POA: Diagnosis not present

## 2020-11-12 DIAGNOSIS — Z85858 Personal history of malignant neoplasm of other endocrine glands: Secondary | ICD-10-CM | POA: Diagnosis not present

## 2020-11-12 DIAGNOSIS — E039 Hypothyroidism, unspecified: Secondary | ICD-10-CM | POA: Diagnosis not present

## 2020-11-12 DIAGNOSIS — C7A8 Other malignant neuroendocrine tumors: Secondary | ICD-10-CM | POA: Diagnosis not present

## 2020-11-12 DIAGNOSIS — E063 Autoimmune thyroiditis: Secondary | ICD-10-CM | POA: Diagnosis not present

## 2020-11-12 DIAGNOSIS — D447 Neoplasm of uncertain behavior of aortic body and other paraganglia: Secondary | ICD-10-CM | POA: Diagnosis not present

## 2020-12-05 DIAGNOSIS — E063 Autoimmune thyroiditis: Secondary | ICD-10-CM | POA: Diagnosis not present

## 2020-12-05 DIAGNOSIS — C7A8 Other malignant neuroendocrine tumors: Secondary | ICD-10-CM | POA: Diagnosis not present

## 2020-12-05 DIAGNOSIS — D447 Neoplasm of uncertain behavior of aortic body and other paraganglia: Secondary | ICD-10-CM | POA: Diagnosis not present

## 2021-02-01 DIAGNOSIS — J0100 Acute maxillary sinusitis, unspecified: Secondary | ICD-10-CM | POA: Diagnosis not present

## 2021-02-05 DIAGNOSIS — J3489 Other specified disorders of nose and nasal sinuses: Secondary | ICD-10-CM | POA: Diagnosis not present

## 2021-02-05 DIAGNOSIS — R519 Headache, unspecified: Secondary | ICD-10-CM | POA: Diagnosis not present

## 2021-02-05 DIAGNOSIS — Z8709 Personal history of other diseases of the respiratory system: Secondary | ICD-10-CM | POA: Diagnosis not present

## 2021-02-11 DIAGNOSIS — Z87891 Personal history of nicotine dependence: Secondary | ICD-10-CM | POA: Diagnosis not present

## 2021-02-11 DIAGNOSIS — Z08 Encounter for follow-up examination after completed treatment for malignant neoplasm: Secondary | ICD-10-CM | POA: Diagnosis not present

## 2021-02-11 DIAGNOSIS — Z923 Personal history of irradiation: Secondary | ICD-10-CM | POA: Diagnosis not present

## 2021-02-11 DIAGNOSIS — D0511 Intraductal carcinoma in situ of right breast: Secondary | ICD-10-CM | POA: Diagnosis not present

## 2021-02-11 DIAGNOSIS — Z9889 Other specified postprocedural states: Secondary | ICD-10-CM | POA: Diagnosis not present

## 2021-02-11 DIAGNOSIS — R921 Mammographic calcification found on diagnostic imaging of breast: Secondary | ICD-10-CM | POA: Diagnosis not present

## 2021-02-11 DIAGNOSIS — R928 Other abnormal and inconclusive findings on diagnostic imaging of breast: Secondary | ICD-10-CM | POA: Diagnosis not present

## 2021-02-11 DIAGNOSIS — Z888 Allergy status to other drugs, medicaments and biological substances status: Secondary | ICD-10-CM | POA: Diagnosis not present

## 2021-02-11 DIAGNOSIS — Z853 Personal history of malignant neoplasm of breast: Secondary | ICD-10-CM | POA: Diagnosis not present

## 2021-02-11 DIAGNOSIS — Z882 Allergy status to sulfonamides status: Secondary | ICD-10-CM | POA: Diagnosis not present

## 2021-03-26 DIAGNOSIS — D2262 Melanocytic nevi of left upper limb, including shoulder: Secondary | ICD-10-CM | POA: Diagnosis not present

## 2021-03-26 DIAGNOSIS — L578 Other skin changes due to chronic exposure to nonionizing radiation: Secondary | ICD-10-CM | POA: Diagnosis not present

## 2021-03-26 DIAGNOSIS — L821 Other seborrheic keratosis: Secondary | ICD-10-CM | POA: Diagnosis not present

## 2021-03-26 DIAGNOSIS — L719 Rosacea, unspecified: Secondary | ICD-10-CM | POA: Diagnosis not present

## 2021-03-26 DIAGNOSIS — D225 Melanocytic nevi of trunk: Secondary | ICD-10-CM | POA: Diagnosis not present

## 2021-03-26 DIAGNOSIS — L814 Other melanin hyperpigmentation: Secondary | ICD-10-CM | POA: Diagnosis not present

## 2021-03-26 DIAGNOSIS — D485 Neoplasm of uncertain behavior of skin: Secondary | ICD-10-CM | POA: Diagnosis not present

## 2021-03-26 DIAGNOSIS — D3617 Benign neoplasm of peripheral nerves and autonomic nervous system of trunk, unspecified: Secondary | ICD-10-CM | POA: Diagnosis not present

## 2021-03-26 DIAGNOSIS — L603 Nail dystrophy: Secondary | ICD-10-CM | POA: Diagnosis not present

## 2021-05-11 DIAGNOSIS — H35372 Puckering of macula, left eye: Secondary | ICD-10-CM | POA: Diagnosis not present

## 2021-05-11 DIAGNOSIS — H2513 Age-related nuclear cataract, bilateral: Secondary | ICD-10-CM | POA: Diagnosis not present

## 2021-05-11 DIAGNOSIS — H5203 Hypermetropia, bilateral: Secondary | ICD-10-CM | POA: Diagnosis not present

## 2021-05-20 DIAGNOSIS — R519 Headache, unspecified: Secondary | ICD-10-CM | POA: Diagnosis not present

## 2021-05-20 DIAGNOSIS — C7A00 Malignant carcinoid tumor of unspecified site: Secondary | ICD-10-CM | POA: Diagnosis not present

## 2021-05-20 DIAGNOSIS — C7B8 Other secondary neuroendocrine tumors: Secondary | ICD-10-CM | POA: Diagnosis not present

## 2021-05-20 DIAGNOSIS — E063 Autoimmune thyroiditis: Secondary | ICD-10-CM | POA: Diagnosis not present

## 2021-05-20 DIAGNOSIS — M85859 Other specified disorders of bone density and structure, unspecified thigh: Secondary | ICD-10-CM | POA: Diagnosis not present

## 2021-05-20 DIAGNOSIS — L80 Vitiligo: Secondary | ICD-10-CM | POA: Diagnosis not present

## 2021-06-06 DIAGNOSIS — Z23 Encounter for immunization: Secondary | ICD-10-CM | POA: Diagnosis not present

## 2021-06-08 DIAGNOSIS — H43813 Vitreous degeneration, bilateral: Secondary | ICD-10-CM | POA: Diagnosis not present

## 2021-06-08 DIAGNOSIS — H35372 Puckering of macula, left eye: Secondary | ICD-10-CM | POA: Diagnosis not present

## 2021-06-08 DIAGNOSIS — H2513 Age-related nuclear cataract, bilateral: Secondary | ICD-10-CM | POA: Diagnosis not present

## 2021-07-14 DIAGNOSIS — H2512 Age-related nuclear cataract, left eye: Secondary | ICD-10-CM | POA: Diagnosis not present

## 2021-07-14 DIAGNOSIS — H25812 Combined forms of age-related cataract, left eye: Secondary | ICD-10-CM | POA: Diagnosis not present

## 2021-07-20 DIAGNOSIS — J069 Acute upper respiratory infection, unspecified: Secondary | ICD-10-CM | POA: Diagnosis not present

## 2021-07-20 DIAGNOSIS — R059 Cough, unspecified: Secondary | ICD-10-CM | POA: Diagnosis not present

## 2021-07-20 DIAGNOSIS — Z20828 Contact with and (suspected) exposure to other viral communicable diseases: Secondary | ICD-10-CM | POA: Diagnosis not present

## 2021-08-14 DIAGNOSIS — L03032 Cellulitis of left toe: Secondary | ICD-10-CM | POA: Diagnosis not present

## 2021-08-14 DIAGNOSIS — B351 Tinea unguium: Secondary | ICD-10-CM | POA: Diagnosis not present

## 2021-08-17 DIAGNOSIS — N3941 Urge incontinence: Secondary | ICD-10-CM | POA: Diagnosis not present

## 2021-08-17 DIAGNOSIS — N281 Cyst of kidney, acquired: Secondary | ICD-10-CM | POA: Diagnosis not present

## 2021-08-17 DIAGNOSIS — R319 Hematuria, unspecified: Secondary | ICD-10-CM | POA: Diagnosis not present

## 2021-08-17 DIAGNOSIS — Z87448 Personal history of other diseases of urinary system: Secondary | ICD-10-CM | POA: Diagnosis not present

## 2021-08-18 DIAGNOSIS — N6489 Other specified disorders of breast: Secondary | ICD-10-CM | POA: Diagnosis not present

## 2021-08-18 DIAGNOSIS — R928 Other abnormal and inconclusive findings on diagnostic imaging of breast: Secondary | ICD-10-CM | POA: Diagnosis not present

## 2021-08-18 DIAGNOSIS — D0511 Intraductal carcinoma in situ of right breast: Secondary | ICD-10-CM | POA: Diagnosis not present

## 2021-08-21 ENCOUNTER — Ambulatory Visit (INDEPENDENT_AMBULATORY_CARE_PROVIDER_SITE_OTHER): Payer: Medicare Other | Admitting: Podiatry

## 2021-08-21 ENCOUNTER — Other Ambulatory Visit: Payer: Self-pay

## 2021-08-21 DIAGNOSIS — B351 Tinea unguium: Secondary | ICD-10-CM

## 2021-08-21 DIAGNOSIS — L600 Ingrowing nail: Secondary | ICD-10-CM | POA: Diagnosis not present

## 2021-08-21 MED ORDER — EFINACONAZOLE 10 % EX SOLN: 1.0000 [drp] | Freq: Every day | CUTANEOUS | 11 refills | Status: AC

## 2021-08-21 NOTE — Patient Instructions (Addendum)
Soak Instructions    THE DAY AFTER THE PROCEDURE  Place 1/4 cup of epsom salts in a quart of warm tap water.  Submerge your foot or feet with outer bandage intact for the initial soak; this will allow the bandage to become moist and wet for easy lift off.  Once you remove your bandage, continue to soak in the solution for 20 minutes.  This soak should be done twice a day.  Next, remove your foot or feet from solution, blot dry the affected area and cover.  You may use a band aid large enough to cover the area or use gauze and tape.  Apply other medications to the area as directed by the doctor such as polysporin neosporin.  IF YOUR SKIN BECOMES IRRITATED WHILE USING THESE INSTRUCTIONS, IT IS OKAY TO SWITCH TO  WHITE VINEGAR AND WATER. Or you may use antibacterial soap and water to keep the toe clean  Monitor for any signs/symptoms of infection. Call the office immediately if any occur or go directly to the emergency room. Call with any questions/concerns.  Efinaconazole Topical Solution- DO NO START THIS ON THE LEFT BIG TOENAIL UNTIL THE PROCEDURE SITE COMPLETELY HEALS What is this medication? EFINACONAZOLE (e FEE na KON a zole) is an antifungal medicine. It is used to treat certain kinds of fungal infections of the toenail. This medicine may be used for other purposes; ask your health care provider or pharmacist if you have questions. COMMON BRAND NAME(S): JUBLIA What should I tell my care team before I take this medication? They need to know if you have any of these conditions: an unusual or allergic reaction to efinaconazole, other medicines, foods, dyes or preservatives pregnant or trying to get pregnant breast-feeding How should I use this medication? This medicine is for external use only. Do not take by mouth. Follow the directions on the label. Wash hands before and after use. Apply this medicine using the provided brush to cover the entire toenail. Do not use your medicine more often  than directed. Finish the full course prescribed by your doctor or health care professional even if you think your condition is better. Do not stop using except on the advice of your doctor or health care professional. Talk to your pediatrician regarding the use of this medicine in children. While this drug may be prescribed for children as young as 6 years for selected conditions, precautions do apply. Overdosage: If you think you have taken too much of this medicine contact a poison control center or emergency room at once. NOTE: This medicine is only for you. Do not share this medicine with others. What if I miss a dose? If you miss a dose, use it as soon as you can. If it is almost time for your next dose, use only that dose. Do not use double or extra doses. What may interact with this medication? Interactions have not been studied. Do not use any other nail products (i.e., nail polish, pedicures) during treatment with this medicine. This list may not describe all possible interactions. Give your health care provider a list of all the medicines, herbs, non-prescription drugs, or dietary supplements you use. Also tell them if you smoke, drink alcohol, or use illegal drugs. Some items may interact with your medicine. What should I watch for while using this medication? Do not get this medicine in your eyes. If you do, rinse out with plenty of cool tap water. Tell your doctor or health care professional if your  symptoms do not start to get better or if they get worse. Wait for at least 10 minutes after bathing before applying this medication. After bathing, make sure that your feet are very dry. Fungal infections like moist conditions. Do not walk around barefoot. To help prevent reinfection, wear freshly washed cotton, not synthetic clothing. Tell your doctor or health care professional if you develop sores or blisters that do not heal properly. If your nail infection returns after you stop using this  medicine, contact your doctor or health care professional. What side effects may I notice from receiving this medication? Side effects that you should report to your doctor or health care professional as soon as possible: allergic reactions like skin rash, itching or hives, swelling of the face, lips, or tongue ingrown toenail Side effects that usually do not require medical attention (report to your doctor or health care professional if they continue or are bothersome): mild skin irritation, burning, or itching This list may not describe all possible side effects. Call your doctor for medical advice about side effects. You may report side effects to FDA at 1-800-FDA-1088. Where should I keep my medication? Keep out of the reach of children. Store at room temperature between 20 and 25 degrees C (68 and 77 degrees F). Keep this medicine in the original container. Throw away any unused medicine after the expiration date. This medicine is flammable. Avoid exposure to heat, fire, flame, and smoking. NOTE: This sheet is a summary. It may not cover all possible information. If you have questions about this medicine, talk to your doctor, pharmacist, or health care provider.  2022 Elsevier/Gold Standard (2019-01-10 00:00:00)

## 2021-08-23 DIAGNOSIS — J0100 Acute maxillary sinusitis, unspecified: Secondary | ICD-10-CM | POA: Diagnosis not present

## 2021-08-23 DIAGNOSIS — U071 COVID-19: Secondary | ICD-10-CM | POA: Diagnosis not present

## 2021-08-24 ENCOUNTER — Telehealth: Payer: Self-pay | Admitting: *Deleted

## 2021-08-24 ENCOUNTER — Other Ambulatory Visit: Payer: Self-pay | Admitting: Podiatry

## 2021-08-24 MED ORDER — CEPHALEXIN 500 MG PO CAPS: 500.0000 mg | ORAL_CAPSULE | Freq: Three times a day (TID) | ORAL | 0 refills | Status: AC

## 2021-08-24 NOTE — Progress Notes (Signed)
Subjective:   Patient ID: Suzanne Bishop, female   DOB: 70 y.o.   MRN: [id number]   HPI 70 year old female presents the office today with concerns of toenail issue to her left big toe.  She states that she went to urgent care though she had a fungus Donika on oral medication because of her fatty liver.  She states about 1 year ago she had ingrown toenail procedure she states that it did not come back in the right middle became thick and discolored.  She has had tenderness to the toenail.   Review of Systems  All other systems reviewed and are negative.  Past Medical History:  Diagnosis Date   Allergy    Androgenic alopecia    Asthma    Cancer ([place])    Osteopenia    Pheochromocytoma of right adrenal gland    2016   Thyroid disease    hypothyroid    Past Surgical History:  Procedure Laterality Date   ABDOMINAL HYSTERECTOMY     CESAREAN SECTION     RHINOPLASTY     TONSILLECTOMY     TUBAL LIGATION     WISDOM TOOTH EXTRACTION       Current Outpatient Medications:    Efinaconazole 10 % SOLN, Apply 1 drop topically daily., Disp: 4 mL, Rfl: 11   Ascorbic Acid (VITAMIN C) 100 MG tablet, Take 100 mg by mouth daily., Disp: , Rfl:    calcium carbonate (OS-CAL - DOSED IN MG OF ELEMENTAL CALCIUM) 1250 (500 CA) MG tablet, Take 600 mg by mouth. , Disp: , Rfl:    famotidine (PEPCID) 10 MG tablet, Take 10 mg by mouth 2 (two) times daily., Disp: , Rfl:    finasteride (PROSCAR) 5 MG tablet, , Disp: , Rfl: 1   fluticasone (FLONASE) 50 MCG/ACT nasal spray, Place into the nose., Disp: , Rfl:    levocetirizine (XYZAL) 5 MG tablet, TK 1 T PO QHS, Disp: , Rfl:    metroNIDAZOLE (METROGEL) 0.75 % gel, Apply topically 2 (two) times daily., Disp: , Rfl:    Minoxidil (ROGAINE MENS) 5 % FOAM, Apply topically., Disp: , Rfl:    Multiple Vitamin (MULTIVITAMIN) tablet, Take 1 tablet by mouth daily., Disp: , Rfl:    omega-3 acid ethyl esters (LOVAZA) 1 g capsule, Take by mouth 2 (two) times daily., Disp: ,  Rfl:    ranitidine (ZANTAC) 150 MG capsule, Take 150 mg by mouth 2 (two) times daily., Disp: , Rfl:    SYNTHROID 100 MCG tablet, , Disp: , Rfl:    SYNTHROID 112 MCG tablet, Take 100 mcg by mouth daily. , Disp: , Rfl: 0   VITAMIN D, CHOLECALCIFEROL, PO, Take 400 Int'l Units by mouth daily., Disp: , Rfl:    zinc gluconate 50 MG tablet, Take by mouth., Disp: , Rfl:   Allergies  Allergen Reactions   Statins Other (See Comments)    Muscles sore   Sulfa Antibiotics Hives          Objective:  Physical Exam  General: AAO x3, NAD  Dermatological: Incurvation present to the left hallux toenail and the nail is loose with underlying nailbed and small amount of clear drainage expressed.  The nail does not appear to be attached distally.  No purulence or abscess.  The other toenails are also hypertrophic, dystrophic with yellow-brown discoloration.  No tenderness at the toenails no edema, erythema or signs of infection.  Vascular: Dorsalis Pedis artery and Posterior Tibial artery pedal pulses are 2/4 bilateral  with immedate capillary fill time. There is no pain with calf compression, swelling, warmth, erythema.   Neruologic: Grossly intact via light touch bilateral.   Musculoskeletal: Tenderness to left hallux toenail.  No other area discomfort.  Muscular strength 5/5 in all groups tested bilateral.  Gait: Unassisted, Nonantalgic.       Assessment:   Left hallux onychodystrophy, ingrown toenail; onychomycosis     Plan:  -Treatment options discussed including all alternatives, risks, and complications -Etiology of symptoms were discussed -At this time, the patient is recommended total nail removal without chemical matricectomy to the symptomatic portion of the nail given the loosening as well as the drainage of the nail.  Risks and complications were discussed with the patient for which they understand and written consent was obtained. Under sterile conditions a total of 3 mL of a mixture  of 2% lidocaine plain and 0.5% Marcaine plain was infiltrated in a hallux block fashion. Once anesthetized, the skin was prepped in sterile fashion. A tourniquet was then applied.  The left hallux toenail was removed in total.  Once the nails were ensured to be removed area was debrided and the underlying skin was intact. There is no purulence identified in the procedure.  Hemostasis achieved.  Antibiotic ointment was applied. A dry sterile dressing was applied. After application of the dressing the tourniquet was removed and there is found to be an immediate capillary refill time to the digit. The patient tolerated the procedure well any complications. Post procedure instructions were discussed the patient for which he verbally understood. Follow-up in one week for nail check or sooner if any problems are to arise. Discussed signs/symptoms of infection and directed to call the office immediately should any occur or go directly to the emergency room. In the meantime, encouraged to call the office with any questions, concerns, changes symptoms. -We will start [place] for the other toenails.  Trula Slade DPM

## 2021-08-24 NOTE — Telephone Encounter (Signed)
Patient is calling because the epsom salts and vinegar in water is causing burning , suggested the antibacterial soaps and water, will try that but she is also concerned that her toe is really red where the nail was and is white around borders ,as if she had been soaking but has stopped,no drainage,wanted to make sure that it was ok. Please advise.

## 2021-09-04 ENCOUNTER — Ambulatory Visit (INDEPENDENT_AMBULATORY_CARE_PROVIDER_SITE_OTHER): Payer: BLUE CROSS/BLUE SHIELD | Admitting: Podiatry

## 2021-09-04 DIAGNOSIS — L600 Ingrowing nail: Secondary | ICD-10-CM

## 2021-09-09 NOTE — Progress Notes (Signed)
Subjective: 70 year old female presents the office today for follow-up evaluation after undergoing nail avulsion of her left hallux nail.  She is not sure how it looks, static checks.  No significant discomfort there is no drainage or pus.  No redness or red streaks.  She has been soaking in Epson salts, antibiotic ointment and a bandage.  No fevers or chills.  Objective: AAO x3, NAD DP/PT pulses palpable bilaterally, CRT less than 3 seconds Status post total nail avulsion left hallux.  Small mount of scabbing dryness is present there is no open sore.  No cellulitis noted.  No drainage or pus or any fluctuation or signs of an abscess. No pain with calf compression, swelling, warmth, erythema  Assessment: Status post total avulsion left hallux  Plan: -All treatment options discussed with the patient including all alternatives, risks, complications.  -This point recommend washing with soap and water daily and dry thoroughly and apply a small amount of antibiotic ointment and a bandage.  Watch for any signs or symptoms of infection.  We will need to monitor the toenail as it comes back in and should there be any issues to let me know. -Patient encouraged to call the office with any questions, concerns, change in symptoms.   Trula Slade DPM

## 2021-09-24 DIAGNOSIS — H01004 Unspecified blepharitis left upper eyelid: Secondary | ICD-10-CM | POA: Diagnosis not present

## 2021-09-24 DIAGNOSIS — H01001 Unspecified blepharitis right upper eyelid: Secondary | ICD-10-CM | POA: Diagnosis not present

## 2021-10-12 DIAGNOSIS — E063 Autoimmune thyroiditis: Secondary | ICD-10-CM | POA: Diagnosis not present

## 2021-10-12 DIAGNOSIS — E785 Hyperlipidemia, unspecified: Secondary | ICD-10-CM | POA: Diagnosis not present

## 2021-10-12 DIAGNOSIS — M810 Age-related osteoporosis without current pathological fracture: Secondary | ICD-10-CM | POA: Diagnosis not present

## 2021-10-27 DIAGNOSIS — Z0000 Encounter for general adult medical examination without abnormal findings: Secondary | ICD-10-CM | POA: Diagnosis not present

## 2021-10-27 DIAGNOSIS — G720 Drug-induced myopathy: Secondary | ICD-10-CM | POA: Diagnosis not present

## 2021-10-27 DIAGNOSIS — Z23 Encounter for immunization: Secondary | ICD-10-CM | POA: Diagnosis not present

## 2021-10-27 DIAGNOSIS — Z853 Personal history of malignant neoplasm of breast: Secondary | ICD-10-CM | POA: Diagnosis not present

## 2021-10-27 DIAGNOSIS — M810 Age-related osteoporosis without current pathological fracture: Secondary | ICD-10-CM | POA: Diagnosis not present

## 2021-10-27 DIAGNOSIS — Z1339 Encounter for screening examination for other mental health and behavioral disorders: Secondary | ICD-10-CM | POA: Diagnosis not present

## 2021-10-27 DIAGNOSIS — D447 Neoplasm of uncertain behavior of aortic body and other paraganglia: Secondary | ICD-10-CM | POA: Diagnosis not present

## 2021-10-27 DIAGNOSIS — E785 Hyperlipidemia, unspecified: Secondary | ICD-10-CM | POA: Diagnosis not present

## 2021-10-27 DIAGNOSIS — Z1331 Encounter for screening for depression: Secondary | ICD-10-CM | POA: Diagnosis not present

## 2021-10-27 DIAGNOSIS — R3129 Other microscopic hematuria: Secondary | ICD-10-CM | POA: Diagnosis not present

## 2021-10-27 DIAGNOSIS — K219 Gastro-esophageal reflux disease without esophagitis: Secondary | ICD-10-CM | POA: Diagnosis not present

## 2021-11-06 DIAGNOSIS — H01001 Unspecified blepharitis right upper eyelid: Secondary | ICD-10-CM | POA: Diagnosis not present

## 2021-11-06 DIAGNOSIS — H43813 Vitreous degeneration, bilateral: Secondary | ICD-10-CM | POA: Diagnosis not present

## 2021-11-06 DIAGNOSIS — H35372 Puckering of macula, left eye: Secondary | ICD-10-CM | POA: Diagnosis not present

## 2021-11-06 DIAGNOSIS — H2511 Age-related nuclear cataract, right eye: Secondary | ICD-10-CM | POA: Diagnosis not present

## 2021-11-25 DIAGNOSIS — Z923 Personal history of irradiation: Secondary | ICD-10-CM | POA: Diagnosis not present

## 2021-11-25 DIAGNOSIS — D447 Neoplasm of uncertain behavior of aortic body and other paraganglia: Secondary | ICD-10-CM | POA: Diagnosis not present

## 2021-11-25 DIAGNOSIS — C7B8 Other secondary neuroendocrine tumors: Secondary | ICD-10-CM | POA: Diagnosis not present

## 2021-11-25 DIAGNOSIS — R519 Headache, unspecified: Secondary | ICD-10-CM | POA: Diagnosis not present

## 2021-11-25 DIAGNOSIS — E039 Hypothyroidism, unspecified: Secondary | ICD-10-CM | POA: Diagnosis not present

## 2021-11-25 DIAGNOSIS — Z789 Other specified health status: Secondary | ICD-10-CM | POA: Diagnosis not present

## 2022-01-04 DIAGNOSIS — H3581 Retinal edema: Secondary | ICD-10-CM | POA: Diagnosis not present

## 2022-01-04 DIAGNOSIS — H35372 Puckering of macula, left eye: Secondary | ICD-10-CM | POA: Diagnosis not present

## 2022-01-05 DIAGNOSIS — H3581 Retinal edema: Secondary | ICD-10-CM | POA: Diagnosis not present

## 2022-01-12 DIAGNOSIS — H3581 Retinal edema: Secondary | ICD-10-CM | POA: Diagnosis not present

## 2022-01-12 DIAGNOSIS — H35372 Puckering of macula, left eye: Secondary | ICD-10-CM | POA: Diagnosis not present

## 2022-02-02 DIAGNOSIS — H3581 Retinal edema: Secondary | ICD-10-CM | POA: Diagnosis not present

## 2022-02-22 DIAGNOSIS — Z87891 Personal history of nicotine dependence: Secondary | ICD-10-CM | POA: Diagnosis not present

## 2022-02-22 DIAGNOSIS — Z923 Personal history of irradiation: Secondary | ICD-10-CM | POA: Diagnosis not present

## 2022-02-22 DIAGNOSIS — Z9889 Other specified postprocedural states: Secondary | ICD-10-CM | POA: Diagnosis not present

## 2022-02-22 DIAGNOSIS — R921 Mammographic calcification found on diagnostic imaging of breast: Secondary | ICD-10-CM | POA: Diagnosis not present

## 2022-02-22 DIAGNOSIS — R928 Other abnormal and inconclusive findings on diagnostic imaging of breast: Secondary | ICD-10-CM | POA: Diagnosis not present

## 2022-02-22 DIAGNOSIS — Z853 Personal history of malignant neoplasm of breast: Secondary | ICD-10-CM | POA: Diagnosis not present

## 2022-02-22 DIAGNOSIS — N6081 Other benign mammary dysplasias of right breast: Secondary | ICD-10-CM | POA: Diagnosis not present

## 2022-02-22 DIAGNOSIS — D0511 Intraductal carcinoma in situ of right breast: Secondary | ICD-10-CM | POA: Diagnosis not present

## 2022-03-08 DIAGNOSIS — H2511 Age-related nuclear cataract, right eye: Secondary | ICD-10-CM | POA: Diagnosis not present

## 2022-03-08 DIAGNOSIS — H35372 Puckering of macula, left eye: Secondary | ICD-10-CM | POA: Diagnosis not present

## 2022-03-08 DIAGNOSIS — Z961 Presence of intraocular lens: Secondary | ICD-10-CM | POA: Diagnosis not present

## 2022-03-08 DIAGNOSIS — H01001 Unspecified blepharitis right upper eyelid: Secondary | ICD-10-CM | POA: Diagnosis not present

## 2022-03-24 DIAGNOSIS — H43811 Vitreous degeneration, right eye: Secondary | ICD-10-CM | POA: Diagnosis not present

## 2022-03-24 DIAGNOSIS — H3581 Retinal edema: Secondary | ICD-10-CM | POA: Diagnosis not present

## 2022-04-08 DIAGNOSIS — D1801 Hemangioma of skin and subcutaneous tissue: Secondary | ICD-10-CM | POA: Diagnosis not present

## 2022-04-08 DIAGNOSIS — L659 Nonscarring hair loss, unspecified: Secondary | ICD-10-CM | POA: Diagnosis not present

## 2022-04-08 DIAGNOSIS — L718 Other rosacea: Secondary | ICD-10-CM | POA: Diagnosis not present

## 2022-04-08 DIAGNOSIS — I788 Other diseases of capillaries: Secondary | ICD-10-CM | POA: Diagnosis not present

## 2022-04-08 DIAGNOSIS — L821 Other seborrheic keratosis: Secondary | ICD-10-CM | POA: Diagnosis not present

## 2022-05-12 DIAGNOSIS — R140 Abdominal distension (gaseous): Secondary | ICD-10-CM | POA: Diagnosis not present

## 2022-05-12 DIAGNOSIS — R110 Nausea: Secondary | ICD-10-CM | POA: Diagnosis not present

## 2022-05-12 DIAGNOSIS — Z800 Family history of malignant neoplasm of digestive organs: Secondary | ICD-10-CM | POA: Diagnosis not present

## 2022-05-27 DIAGNOSIS — K5909 Other constipation: Secondary | ICD-10-CM | POA: Diagnosis not present

## 2022-05-27 DIAGNOSIS — D447 Neoplasm of uncertain behavior of aortic body and other paraganglia: Secondary | ICD-10-CM | POA: Diagnosis not present

## 2022-05-27 DIAGNOSIS — K5901 Slow transit constipation: Secondary | ICD-10-CM | POA: Diagnosis not present

## 2022-05-27 DIAGNOSIS — R109 Unspecified abdominal pain: Secondary | ICD-10-CM | POA: Diagnosis not present

## 2022-06-09 DIAGNOSIS — K5730 Diverticulosis of large intestine without perforation or abscess without bleeding: Secondary | ICD-10-CM | POA: Diagnosis not present

## 2022-06-09 DIAGNOSIS — K5909 Other constipation: Secondary | ICD-10-CM | POA: Diagnosis not present

## 2022-06-09 DIAGNOSIS — R110 Nausea: Secondary | ICD-10-CM | POA: Diagnosis not present

## 2022-06-09 DIAGNOSIS — R140 Abdominal distension (gaseous): Secondary | ICD-10-CM | POA: Diagnosis not present

## 2022-06-09 DIAGNOSIS — D61818 Other pancytopenia: Secondary | ICD-10-CM | POA: Diagnosis not present

## 2022-06-13 DIAGNOSIS — Z23 Encounter for immunization: Secondary | ICD-10-CM | POA: Diagnosis not present

## 2022-07-25 DIAGNOSIS — J019 Acute sinusitis, unspecified: Secondary | ICD-10-CM | POA: Diagnosis not present

## 2022-07-25 DIAGNOSIS — J029 Acute pharyngitis, unspecified: Secondary | ICD-10-CM | POA: Diagnosis not present

## 2022-08-04 DIAGNOSIS — R140 Abdominal distension (gaseous): Secondary | ICD-10-CM | POA: Diagnosis not present

## 2022-08-04 DIAGNOSIS — K5909 Other constipation: Secondary | ICD-10-CM | POA: Diagnosis not present

## 2022-08-04 DIAGNOSIS — R1030 Lower abdominal pain, unspecified: Secondary | ICD-10-CM | POA: Diagnosis not present

## 2022-08-04 DIAGNOSIS — K5730 Diverticulosis of large intestine without perforation or abscess without bleeding: Secondary | ICD-10-CM | POA: Diagnosis not present

## 2022-08-04 DIAGNOSIS — R110 Nausea: Secondary | ICD-10-CM | POA: Diagnosis not present

## 2022-08-18 DIAGNOSIS — C50011 Malignant neoplasm of nipple and areola, right female breast: Secondary | ICD-10-CM | POA: Diagnosis not present

## 2022-08-18 DIAGNOSIS — R5383 Other fatigue: Secondary | ICD-10-CM | POA: Diagnosis not present

## 2022-09-02 DIAGNOSIS — C50011 Malignant neoplasm of nipple and areola, right female breast: Secondary | ICD-10-CM | POA: Diagnosis not present

## 2022-10-05 DIAGNOSIS — Z8742 Personal history of other diseases of the female genital tract: Secondary | ICD-10-CM | POA: Diagnosis not present

## 2022-10-05 DIAGNOSIS — Z1272 Encounter for screening for malignant neoplasm of vagina: Secondary | ICD-10-CM | POA: Diagnosis not present

## 2022-10-05 DIAGNOSIS — Z1151 Encounter for screening for human papillomavirus (HPV): Secondary | ICD-10-CM | POA: Diagnosis not present

## 2022-10-05 DIAGNOSIS — Z124 Encounter for screening for malignant neoplasm of cervix: Secondary | ICD-10-CM | POA: Diagnosis not present

## 2022-11-03 DIAGNOSIS — M810 Age-related osteoporosis without current pathological fracture: Secondary | ICD-10-CM | POA: Diagnosis not present

## 2022-11-03 DIAGNOSIS — K219 Gastro-esophageal reflux disease without esophagitis: Secondary | ICD-10-CM | POA: Diagnosis not present

## 2022-11-03 DIAGNOSIS — E063 Autoimmune thyroiditis: Secondary | ICD-10-CM | POA: Diagnosis not present

## 2022-11-03 DIAGNOSIS — R7989 Other specified abnormal findings of blood chemistry: Secondary | ICD-10-CM | POA: Diagnosis not present

## 2022-11-03 DIAGNOSIS — E785 Hyperlipidemia, unspecified: Secondary | ICD-10-CM | POA: Diagnosis not present

## 2022-11-10 DIAGNOSIS — G720 Drug-induced myopathy: Secondary | ICD-10-CM | POA: Diagnosis not present

## 2022-11-10 DIAGNOSIS — E039 Hypothyroidism, unspecified: Secondary | ICD-10-CM | POA: Diagnosis not present

## 2022-11-10 DIAGNOSIS — M810 Age-related osteoporosis without current pathological fracture: Secondary | ICD-10-CM | POA: Diagnosis not present

## 2022-11-10 DIAGNOSIS — Z1331 Encounter for screening for depression: Secondary | ICD-10-CM | POA: Diagnosis not present

## 2022-11-10 DIAGNOSIS — Z0000 Encounter for general adult medical examination without abnormal findings: Secondary | ICD-10-CM | POA: Diagnosis not present

## 2022-11-10 DIAGNOSIS — Z853 Personal history of malignant neoplasm of breast: Secondary | ICD-10-CM | POA: Diagnosis not present

## 2022-11-10 DIAGNOSIS — D72819 Decreased white blood cell count, unspecified: Secondary | ICD-10-CM | POA: Diagnosis not present

## 2022-11-10 DIAGNOSIS — Z1339 Encounter for screening examination for other mental health and behavioral disorders: Secondary | ICD-10-CM | POA: Diagnosis not present

## 2022-11-10 DIAGNOSIS — R3129 Other microscopic hematuria: Secondary | ICD-10-CM | POA: Diagnosis not present

## 2022-11-10 DIAGNOSIS — E785 Hyperlipidemia, unspecified: Secondary | ICD-10-CM | POA: Diagnosis not present

## 2022-11-10 DIAGNOSIS — K219 Gastro-esophageal reflux disease without esophagitis: Secondary | ICD-10-CM | POA: Diagnosis not present

## 2022-11-10 DIAGNOSIS — R82998 Other abnormal findings in urine: Secondary | ICD-10-CM | POA: Diagnosis not present

## 2022-11-10 DIAGNOSIS — D447 Neoplasm of uncertain behavior of aortic body and other paraganglia: Secondary | ICD-10-CM | POA: Diagnosis not present

## 2022-11-19 DIAGNOSIS — D61818 Other pancytopenia: Secondary | ICD-10-CM | POA: Diagnosis not present

## 2022-11-19 DIAGNOSIS — D709 Neutropenia, unspecified: Secondary | ICD-10-CM | POA: Diagnosis not present

## 2022-11-19 DIAGNOSIS — D649 Anemia, unspecified: Secondary | ICD-10-CM | POA: Diagnosis not present

## 2022-12-08 DIAGNOSIS — K5790 Diverticulosis of intestine, part unspecified, without perforation or abscess without bleeding: Secondary | ICD-10-CM | POA: Diagnosis not present

## 2022-12-08 DIAGNOSIS — K581 Irritable bowel syndrome with constipation: Secondary | ICD-10-CM | POA: Diagnosis not present

## 2022-12-08 DIAGNOSIS — R1033 Periumbilical pain: Secondary | ICD-10-CM | POA: Diagnosis not present

## 2022-12-08 DIAGNOSIS — R110 Nausea: Secondary | ICD-10-CM | POA: Diagnosis not present

## 2022-12-08 DIAGNOSIS — R1013 Epigastric pain: Secondary | ICD-10-CM | POA: Diagnosis not present

## 2022-12-08 DIAGNOSIS — K5909 Other constipation: Secondary | ICD-10-CM | POA: Diagnosis not present

## 2022-12-08 DIAGNOSIS — R140 Abdominal distension (gaseous): Secondary | ICD-10-CM | POA: Diagnosis not present

## 2022-12-24 DIAGNOSIS — R1033 Periumbilical pain: Secondary | ICD-10-CM | POA: Diagnosis not present

## 2022-12-24 DIAGNOSIS — R109 Unspecified abdominal pain: Secondary | ICD-10-CM | POA: Diagnosis not present

## 2022-12-29 DIAGNOSIS — Z789 Other specified health status: Secondary | ICD-10-CM | POA: Diagnosis not present

## 2022-12-29 DIAGNOSIS — C7A098 Malignant carcinoid tumors of other sites: Secondary | ICD-10-CM | POA: Diagnosis not present

## 2022-12-29 DIAGNOSIS — E063 Autoimmune thyroiditis: Secondary | ICD-10-CM | POA: Diagnosis not present

## 2022-12-29 DIAGNOSIS — D447 Neoplasm of uncertain behavior of aortic body and other paraganglia: Secondary | ICD-10-CM | POA: Diagnosis not present

## 2023-01-18 DIAGNOSIS — R21 Rash and other nonspecific skin eruption: Secondary | ICD-10-CM | POA: Diagnosis not present

## 2023-01-18 DIAGNOSIS — W57XXXA Bitten or stung by nonvenomous insect and other nonvenomous arthropods, initial encounter: Secondary | ICD-10-CM | POA: Diagnosis not present

## 2023-02-01 DIAGNOSIS — K2950 Unspecified chronic gastritis without bleeding: Secondary | ICD-10-CM | POA: Diagnosis not present

## 2023-02-01 DIAGNOSIS — Z87891 Personal history of nicotine dependence: Secondary | ICD-10-CM | POA: Diagnosis not present

## 2023-02-01 DIAGNOSIS — K2980 Duodenitis without bleeding: Secondary | ICD-10-CM | POA: Diagnosis not present

## 2023-02-01 DIAGNOSIS — E063 Autoimmune thyroiditis: Secondary | ICD-10-CM | POA: Diagnosis not present

## 2023-02-01 DIAGNOSIS — R1013 Epigastric pain: Secondary | ICD-10-CM | POA: Diagnosis not present

## 2023-02-01 DIAGNOSIS — Z853 Personal history of malignant neoplasm of breast: Secondary | ICD-10-CM | POA: Diagnosis not present

## 2023-02-01 DIAGNOSIS — K3189 Other diseases of stomach and duodenum: Secondary | ICD-10-CM | POA: Diagnosis not present

## 2023-02-01 DIAGNOSIS — R110 Nausea: Secondary | ICD-10-CM | POA: Diagnosis not present

## 2023-02-10 DIAGNOSIS — T162XXA Foreign body in left ear, initial encounter: Secondary | ICD-10-CM | POA: Diagnosis not present

## 2023-02-14 DIAGNOSIS — D709 Neutropenia, unspecified: Secondary | ICD-10-CM | POA: Diagnosis not present

## 2023-02-28 DIAGNOSIS — Z86000 Personal history of in-situ neoplasm of breast: Secondary | ICD-10-CM | POA: Diagnosis not present

## 2023-02-28 DIAGNOSIS — R928 Other abnormal and inconclusive findings on diagnostic imaging of breast: Secondary | ICD-10-CM | POA: Diagnosis not present

## 2023-02-28 DIAGNOSIS — C50011 Malignant neoplasm of nipple and areola, right female breast: Secondary | ICD-10-CM | POA: Diagnosis not present

## 2023-02-28 DIAGNOSIS — Z1231 Encounter for screening mammogram for malignant neoplasm of breast: Secondary | ICD-10-CM | POA: Diagnosis not present

## 2023-02-28 DIAGNOSIS — Z9889 Other specified postprocedural states: Secondary | ICD-10-CM | POA: Diagnosis not present

## 2023-02-28 DIAGNOSIS — R92323 Mammographic fibroglandular density, bilateral breasts: Secondary | ICD-10-CM | POA: Diagnosis not present

## 2023-03-10 DIAGNOSIS — H2511 Age-related nuclear cataract, right eye: Secondary | ICD-10-CM | POA: Diagnosis not present

## 2023-03-10 DIAGNOSIS — H52203 Unspecified astigmatism, bilateral: Secondary | ICD-10-CM | POA: Diagnosis not present

## 2023-03-10 DIAGNOSIS — Z961 Presence of intraocular lens: Secondary | ICD-10-CM | POA: Diagnosis not present

## 2023-03-22 DIAGNOSIS — L820 Inflamed seborrheic keratosis: Secondary | ICD-10-CM | POA: Diagnosis not present

## 2023-03-22 DIAGNOSIS — L720 Epidermal cyst: Secondary | ICD-10-CM | POA: Diagnosis not present

## 2023-03-22 DIAGNOSIS — L718 Other rosacea: Secondary | ICD-10-CM | POA: Diagnosis not present

## 2023-04-12 DIAGNOSIS — H2511 Age-related nuclear cataract, right eye: Secondary | ICD-10-CM | POA: Diagnosis not present

## 2023-04-12 DIAGNOSIS — H25811 Combined forms of age-related cataract, right eye: Secondary | ICD-10-CM | POA: Diagnosis not present

## 2023-04-12 DIAGNOSIS — Z961 Presence of intraocular lens: Secondary | ICD-10-CM | POA: Diagnosis not present

## 2023-05-11 DIAGNOSIS — R110 Nausea: Secondary | ICD-10-CM | POA: Diagnosis not present

## 2023-05-11 DIAGNOSIS — K2980 Duodenitis without bleeding: Secondary | ICD-10-CM | POA: Diagnosis not present

## 2023-05-11 DIAGNOSIS — D709 Neutropenia, unspecified: Secondary | ICD-10-CM | POA: Diagnosis not present

## 2023-05-11 DIAGNOSIS — K581 Irritable bowel syndrome with constipation: Secondary | ICD-10-CM | POA: Diagnosis not present

## 2023-05-11 DIAGNOSIS — R1013 Epigastric pain: Secondary | ICD-10-CM | POA: Diagnosis not present

## 2023-05-11 DIAGNOSIS — R140 Abdominal distension (gaseous): Secondary | ICD-10-CM | POA: Diagnosis not present

## 2023-05-11 DIAGNOSIS — K5790 Diverticulosis of intestine, part unspecified, without perforation or abscess without bleeding: Secondary | ICD-10-CM | POA: Diagnosis not present

## 2023-05-11 DIAGNOSIS — K2930 Chronic superficial gastritis without bleeding: Secondary | ICD-10-CM | POA: Diagnosis not present

## 2023-05-11 DIAGNOSIS — E039 Hypothyroidism, unspecified: Secondary | ICD-10-CM | POA: Diagnosis not present

## 2023-05-11 DIAGNOSIS — K5909 Other constipation: Secondary | ICD-10-CM | POA: Diagnosis not present

## 2023-05-11 DIAGNOSIS — Z139 Encounter for screening, unspecified: Secondary | ICD-10-CM | POA: Diagnosis not present

## 2023-05-19 DIAGNOSIS — R208 Other disturbances of skin sensation: Secondary | ICD-10-CM | POA: Diagnosis not present

## 2023-05-19 DIAGNOSIS — L298 Other pruritus: Secondary | ICD-10-CM | POA: Diagnosis not present

## 2023-06-13 DIAGNOSIS — D709 Neutropenia, unspecified: Secondary | ICD-10-CM | POA: Diagnosis not present

## 2023-06-16 DIAGNOSIS — Z23 Encounter for immunization: Secondary | ICD-10-CM | POA: Diagnosis not present

## 2023-06-23 DIAGNOSIS — E039 Hypothyroidism, unspecified: Secondary | ICD-10-CM | POA: Diagnosis not present

## 2023-07-08 DIAGNOSIS — H01004 Unspecified blepharitis left upper eyelid: Secondary | ICD-10-CM | POA: Diagnosis not present

## 2023-07-08 DIAGNOSIS — H04123 Dry eye syndrome of bilateral lacrimal glands: Secondary | ICD-10-CM | POA: Diagnosis not present

## 2023-07-08 DIAGNOSIS — H01001 Unspecified blepharitis right upper eyelid: Secondary | ICD-10-CM | POA: Diagnosis not present

## 2023-07-20 DIAGNOSIS — D61818 Other pancytopenia: Secondary | ICD-10-CM | POA: Diagnosis not present

## 2023-07-21 DIAGNOSIS — M9902 Segmental and somatic dysfunction of thoracic region: Secondary | ICD-10-CM | POA: Diagnosis not present

## 2023-07-21 DIAGNOSIS — M4125 Other idiopathic scoliosis, thoracolumbar region: Secondary | ICD-10-CM | POA: Diagnosis not present

## 2023-07-21 DIAGNOSIS — S134XXA Sprain of ligaments of cervical spine, initial encounter: Secondary | ICD-10-CM | POA: Diagnosis not present

## 2023-07-21 DIAGNOSIS — M9903 Segmental and somatic dysfunction of lumbar region: Secondary | ICD-10-CM | POA: Diagnosis not present

## 2023-07-21 DIAGNOSIS — S335XXA Sprain of ligaments of lumbar spine, initial encounter: Secondary | ICD-10-CM | POA: Diagnosis not present

## 2023-07-21 DIAGNOSIS — M9901 Segmental and somatic dysfunction of cervical region: Secondary | ICD-10-CM | POA: Diagnosis not present

## 2023-08-04 DIAGNOSIS — E039 Hypothyroidism, unspecified: Secondary | ICD-10-CM | POA: Diagnosis not present

## 2023-08-19 DIAGNOSIS — R070 Pain in throat: Secondary | ICD-10-CM | POA: Diagnosis not present

## 2023-08-24 DIAGNOSIS — D0511 Intraductal carcinoma in situ of right breast: Secondary | ICD-10-CM | POA: Diagnosis not present

## 2023-08-24 DIAGNOSIS — Z87891 Personal history of nicotine dependence: Secondary | ICD-10-CM | POA: Diagnosis not present

## 2023-08-24 DIAGNOSIS — Z9011 Acquired absence of right breast and nipple: Secondary | ICD-10-CM | POA: Diagnosis not present

## 2023-08-24 DIAGNOSIS — R5383 Other fatigue: Secondary | ICD-10-CM | POA: Diagnosis not present

## 2023-08-24 DIAGNOSIS — Z923 Personal history of irradiation: Secondary | ICD-10-CM | POA: Diagnosis not present

## 2023-08-29 DIAGNOSIS — H01001 Unspecified blepharitis right upper eyelid: Secondary | ICD-10-CM | POA: Diagnosis not present

## 2023-08-29 DIAGNOSIS — H01004 Unspecified blepharitis left upper eyelid: Secondary | ICD-10-CM | POA: Diagnosis not present

## 2023-09-27 DIAGNOSIS — D61818 Other pancytopenia: Secondary | ICD-10-CM | POA: Diagnosis not present

## 2023-10-19 ENCOUNTER — Other Ambulatory Visit: Payer: Self-pay | Admitting: Adult Health

## 2023-10-19 DIAGNOSIS — M1990 Unspecified osteoarthritis, unspecified site: Secondary | ICD-10-CM | POA: Diagnosis not present

## 2023-10-19 DIAGNOSIS — M4135 Thoracogenic scoliosis, thoracolumbar region: Secondary | ICD-10-CM | POA: Diagnosis not present

## 2023-10-19 DIAGNOSIS — M542 Cervicalgia: Secondary | ICD-10-CM

## 2023-10-19 DIAGNOSIS — G8929 Other chronic pain: Secondary | ICD-10-CM | POA: Diagnosis not present

## 2023-10-19 DIAGNOSIS — M546 Pain in thoracic spine: Secondary | ICD-10-CM | POA: Diagnosis not present

## 2023-10-19 DIAGNOSIS — M810 Age-related osteoporosis without current pathological fracture: Secondary | ICD-10-CM | POA: Diagnosis not present

## 2023-10-19 DIAGNOSIS — M545 Low back pain, unspecified: Secondary | ICD-10-CM | POA: Diagnosis not present

## 2023-10-21 DIAGNOSIS — D709 Neutropenia, unspecified: Secondary | ICD-10-CM | POA: Diagnosis not present

## 2023-10-31 ENCOUNTER — Ambulatory Visit
Admission: RE | Admit: 2023-10-31 | Discharge: 2023-10-31 | Disposition: A | Discharge status: Home / Self Care | Payer: Medicare Other | Source: Ambulatory Visit | Attending: Adult Health | Admitting: Adult Health

## 2023-10-31 DIAGNOSIS — G8929 Other chronic pain: Secondary | ICD-10-CM

## 2023-10-31 DIAGNOSIS — M542 Cervicalgia: Secondary | ICD-10-CM

## 2023-10-31 DIAGNOSIS — M1990 Unspecified osteoarthritis, unspecified site: Secondary | ICD-10-CM

## 2023-11-08 DIAGNOSIS — E785 Hyperlipidemia, unspecified: Secondary | ICD-10-CM | POA: Diagnosis not present

## 2023-11-08 DIAGNOSIS — E039 Hypothyroidism, unspecified: Secondary | ICD-10-CM | POA: Diagnosis not present

## 2023-11-08 DIAGNOSIS — R7989 Other specified abnormal findings of blood chemistry: Secondary | ICD-10-CM | POA: Diagnosis not present

## 2023-11-08 DIAGNOSIS — M810 Age-related osteoporosis without current pathological fracture: Secondary | ICD-10-CM | POA: Diagnosis not present

## 2023-11-10 DIAGNOSIS — M542 Cervicalgia: Secondary | ICD-10-CM | POA: Diagnosis not present

## 2023-11-15 DIAGNOSIS — Z0000 Encounter for general adult medical examination without abnormal findings: Secondary | ICD-10-CM | POA: Diagnosis not present

## 2023-11-15 DIAGNOSIS — G720 Drug-induced myopathy: Secondary | ICD-10-CM | POA: Diagnosis not present

## 2023-11-15 DIAGNOSIS — Z1331 Encounter for screening for depression: Secondary | ICD-10-CM | POA: Diagnosis not present

## 2023-11-15 DIAGNOSIS — M1990 Unspecified osteoarthritis, unspecified site: Secondary | ICD-10-CM | POA: Diagnosis not present

## 2023-11-15 DIAGNOSIS — E039 Hypothyroidism, unspecified: Secondary | ICD-10-CM | POA: Diagnosis not present

## 2023-11-15 DIAGNOSIS — M810 Age-related osteoporosis without current pathological fracture: Secondary | ICD-10-CM | POA: Diagnosis not present

## 2023-11-15 DIAGNOSIS — M542 Cervicalgia: Secondary | ICD-10-CM | POA: Diagnosis not present

## 2023-11-15 DIAGNOSIS — R5383 Other fatigue: Secondary | ICD-10-CM | POA: Diagnosis not present

## 2023-11-15 DIAGNOSIS — D447 Neoplasm of uncertain behavior of aortic body and other paraganglia: Secondary | ICD-10-CM | POA: Diagnosis not present

## 2023-11-15 DIAGNOSIS — Z1339 Encounter for screening examination for other mental health and behavioral disorders: Secondary | ICD-10-CM | POA: Diagnosis not present

## 2023-11-15 DIAGNOSIS — M545 Low back pain, unspecified: Secondary | ICD-10-CM | POA: Diagnosis not present

## 2023-11-15 DIAGNOSIS — M4135 Thoracogenic scoliosis, thoracolumbar region: Secondary | ICD-10-CM | POA: Diagnosis not present

## 2023-11-15 DIAGNOSIS — D61818 Other pancytopenia: Secondary | ICD-10-CM | POA: Diagnosis not present

## 2023-11-15 DIAGNOSIS — E785 Hyperlipidemia, unspecified: Secondary | ICD-10-CM | POA: Diagnosis not present

## 2023-11-15 DIAGNOSIS — Z853 Personal history of malignant neoplasm of breast: Secondary | ICD-10-CM | POA: Diagnosis not present

## 2023-11-16 DIAGNOSIS — M542 Cervicalgia: Secondary | ICD-10-CM | POA: Diagnosis not present

## 2023-11-18 DIAGNOSIS — D61818 Other pancytopenia: Secondary | ICD-10-CM | POA: Diagnosis not present

## 2023-11-18 DIAGNOSIS — D709 Neutropenia, unspecified: Secondary | ICD-10-CM | POA: Diagnosis not present

## 2023-11-29 DIAGNOSIS — M545 Low back pain, unspecified: Secondary | ICD-10-CM | POA: Diagnosis not present

## 2023-11-29 DIAGNOSIS — G8929 Other chronic pain: Secondary | ICD-10-CM | POA: Diagnosis not present

## 2023-11-29 DIAGNOSIS — M542 Cervicalgia: Secondary | ICD-10-CM | POA: Diagnosis not present

## 2023-12-15 DIAGNOSIS — G8929 Other chronic pain: Secondary | ICD-10-CM | POA: Diagnosis not present

## 2023-12-15 DIAGNOSIS — M542 Cervicalgia: Secondary | ICD-10-CM | POA: Diagnosis not present

## 2023-12-15 DIAGNOSIS — M545 Low back pain, unspecified: Secondary | ICD-10-CM | POA: Diagnosis not present

## 2024-01-04 DIAGNOSIS — C7A098 Malignant carcinoid tumors of other sites: Secondary | ICD-10-CM | POA: Diagnosis not present

## 2024-01-04 DIAGNOSIS — E039 Hypothyroidism, unspecified: Secondary | ICD-10-CM | POA: Diagnosis not present

## 2024-02-17 ENCOUNTER — Other Ambulatory Visit (HOSPITAL_COMMUNITY): Payer: Self-pay | Admitting: Registered Nurse

## 2024-02-17 DIAGNOSIS — K5909 Other constipation: Secondary | ICD-10-CM | POA: Diagnosis not present

## 2024-02-17 DIAGNOSIS — R634 Abnormal weight loss: Secondary | ICD-10-CM | POA: Diagnosis not present

## 2024-02-17 DIAGNOSIS — D61818 Other pancytopenia: Secondary | ICD-10-CM | POA: Diagnosis not present

## 2024-02-17 DIAGNOSIS — R110 Nausea: Secondary | ICD-10-CM | POA: Diagnosis not present

## 2024-02-17 DIAGNOSIS — R140 Abdominal distension (gaseous): Secondary | ICD-10-CM

## 2024-02-17 DIAGNOSIS — R1031 Right lower quadrant pain: Secondary | ICD-10-CM | POA: Diagnosis not present

## 2024-02-17 DIAGNOSIS — D72819 Decreased white blood cell count, unspecified: Secondary | ICD-10-CM | POA: Diagnosis not present

## 2024-02-17 DIAGNOSIS — K219 Gastro-esophageal reflux disease without esophagitis: Secondary | ICD-10-CM | POA: Diagnosis not present

## 2024-02-17 DIAGNOSIS — K649 Unspecified hemorrhoids: Secondary | ICD-10-CM

## 2024-02-18 ENCOUNTER — Ambulatory Visit (HOSPITAL_BASED_OUTPATIENT_CLINIC_OR_DEPARTMENT_OTHER)
Admission: RE | Admit: 2024-02-18 | Discharge: 2024-02-18 | Disposition: A | Discharge status: Home / Self Care | Source: Ambulatory Visit | Attending: Registered Nurse | Admitting: Registered Nurse

## 2024-02-18 DIAGNOSIS — R634 Abnormal weight loss: Secondary | ICD-10-CM | POA: Diagnosis not present

## 2024-02-18 DIAGNOSIS — R140 Abdominal distension (gaseous): Secondary | ICD-10-CM | POA: Insufficient documentation

## 2024-02-18 DIAGNOSIS — R1031 Right lower quadrant pain: Secondary | ICD-10-CM | POA: Insufficient documentation

## 2024-02-18 DIAGNOSIS — Z90710 Acquired absence of both cervix and uterus: Secondary | ICD-10-CM | POA: Diagnosis not present

## 2024-02-18 DIAGNOSIS — K649 Unspecified hemorrhoids: Secondary | ICD-10-CM | POA: Diagnosis not present

## 2024-02-18 MED ORDER — IOHEXOL 300 MG/ML  SOLN
100.0000 mL | Freq: Once | INTRAMUSCULAR | Status: AC | PRN
  Administered 2024-02-18: 100 mL via INTRAVENOUS

## 2024-02-18 MED ORDER — BARIUM SULFATE 2 % PO SUSP
450.0000 mL | Freq: Once | ORAL | Status: AC
  Administered 2024-02-18: 450 mL via ORAL

## 2024-02-20 ENCOUNTER — Encounter (HOSPITAL_COMMUNITY): Payer: Self-pay

## 2024-02-20 ENCOUNTER — Other Ambulatory Visit: Payer: Self-pay

## 2024-02-20 ENCOUNTER — Ambulatory Visit: Admitting: Podiatry

## 2024-02-20 ENCOUNTER — Emergency Department (HOSPITAL_COMMUNITY)
Admission: EM | Admit: 2024-02-20 | Discharge: 2024-02-20 | Disposition: A | Discharge status: Home / Self Care | Attending: Emergency Medicine | Admitting: Emergency Medicine

## 2024-02-20 DIAGNOSIS — E039 Hypothyroidism, unspecified: Secondary | ICD-10-CM | POA: Diagnosis not present

## 2024-02-20 DIAGNOSIS — K645 Perianal venous thrombosis: Secondary | ICD-10-CM | POA: Diagnosis not present

## 2024-02-20 DIAGNOSIS — K6289 Other specified diseases of anus and rectum: Secondary | ICD-10-CM | POA: Diagnosis present

## 2024-02-20 DIAGNOSIS — K644 Residual hemorrhoidal skin tags: Secondary | ICD-10-CM | POA: Diagnosis not present

## 2024-02-20 NOTE — ED Provider Notes (Signed)
 [place] EMERGENCY DEPARTMENT AT [facility] Provider Note   CSN: [id number] Arrival date & time: 02/20/24  1040     History Chief Complaint  Patient presents with   Hemorrhoids    Suzanne Bishop is a 73 y.o. female with history of external hemorrhoids and hypothyroidism and pancytopenia presents emerged part today for evaluation of external hemorrhoids.  Patient reports that she has been having this for the past 2 weeks but has been painful over the past week.  She was seen by her practitioner on Friday however they could not get her to see anybody for removal of this and called her on Monday and told her to come into the emergency department.  She is not having any difficulty with defecation.  Is not having pain with sitting.  She reports that she has had multiple hemorrhoids before however they have usually resolved.  She is not having any bleeding.  No abdominal pain, nausea, vomiting, or any fevers.  No urinary difficulty.  HPI     Home Medications Prior to Admission medications   Medication Sig Start Date End Date Taking? Authorizing Provider  Ascorbic Acid (VITAMIN C) 100 MG tablet Take 100 mg by mouth daily.    [provider]  calcium carbonate (OS-CAL - DOSED IN MG OF ELEMENTAL CALCIUM) 1250 (500 CA) MG tablet Take 600 mg by mouth.     [provider]  cephALEXin  (KEFLEX ) 500 MG capsule Take 1 capsule (500 mg total) by mouth 3 (three) times daily. 08/24/21   Charity Conch, DPM  Efinaconazole  10 % SOLN Apply 1 drop topically daily. 08/21/21   Charity Conch, DPM  famotidine (PEPCID) 10 MG tablet Take 10 mg by mouth 2 (two) times daily.    [provider]  finasteride (PROSCAR) 5 MG tablet  07/18/14   [provider]  fluticasone (FLONASE) 50 MCG/ACT nasal spray Place into the nose.    [provider]  levocetirizine (XYZAL) 5 MG tablet TK 1 T PO QHS 06/09/19   [provider]  metroNIDAZOLE (METROGEL)  0.75 % gel Apply topically 2 (two) times daily.    [provider]  Minoxidil (ROGAINE MENS) 5 % FOAM Apply topically.    [provider]  Multiple Vitamin (MULTIVITAMIN) tablet Take 1 tablet by mouth daily.    [provider]  omega-3 acid ethyl esters (LOVAZA) 1 g capsule Take by mouth 2 (two) times daily.    [provider]  ranitidine (ZANTAC) 150 MG capsule Take 150 mg by mouth 2 (two) times daily.    [provider]  SYNTHROID 100 MCG tablet  04/27/19   [provider]  SYNTHROID 112 MCG tablet Take 100 mcg by mouth daily.  08/02/15   [provider]  VITAMIN D, CHOLECALCIFEROL, PO Take 400 Int'l Units by mouth daily.    [provider]  zinc  gluconate 50 MG tablet Take by mouth.    [provider]      Allergies    Statins and Sulfa antibiotics    Review of Systems   Review of Systems  Constitutional:  Negative for chills and fever.  Gastrointestinal:  Positive for rectal pain. Negative for abdominal pain, anal bleeding, blood in stool, constipation, diarrhea, nausea and vomiting.  Genitourinary:  Negative for dysuria and hematuria.    Physical Exam Updated Vital Signs BP 137/63 (BP Location: Left Arm)   Pulse 68   Temp 98.4 F (36.9 C) (Oral)  Resp 14   Ht 5' 3 (1.6 m)   Wt 52.2 kg   LMP 08/13/1994   SpO2 98%   BMI 20.37 kg/m  Physical Exam Vitals and nursing note reviewed. Exam conducted with a chaperone present [facility] [place]- EMT-P).  Constitutional:      General: She is not in acute distress.    Appearance: She is not ill-appearing or toxic-appearing.  HENT:     Ears:     Comments: Hearing aids in bilateral ears Eyes:     General: No scleral icterus. Cardiovascular:     Rate and Rhythm: Normal rate.  Pulmonary:     Effort: Pulmonary effort is normal. No respiratory distress.  Abdominal:     Palpations: Abdomen is soft.     Tenderness: There is no abdominal tenderness.   Genitourinary:      Comments: Right thrombosed hemorrhoid present at the 3 to 5 o'clock position with 12:00 PM gluteal cleft.  No surrounding erythema or red streaking.  No fluctuance induration.  On DRE no blood or extension noted.  No large mass palpated. Skin:    General: Skin is warm and dry.  Neurological:     Mental Status: She is alert.     Gait: Gait normal.     ED Results / Procedures / Treatments   Labs (all labs ordered are listed, but only abnormal results are displayed) Labs Reviewed - No data to display  EKG None  Radiology No results found.  Procedures Procedures   Medications Ordered in ED Medications - No data to display  ED Course/ Medical Decision Making/ A&P   Medical Decision Making  73 y.o. female presents to the ER today for evaluation of external thrombosed hemorrhoid. Differential diagnosis includes but is not limited to thrombosed hemorrhoid, external hemorrhoid, prolapsed, fistula. Vital signs unremarkable. Physical exam as noted above.   Patient's thrombosed hemorrhoids are present for at least 1 to 2 weeks.  Given her history of pancytopenia as well as her age, I do feel the risks outweigh the benefits with removing this clot.  She has tried some Preparation H gel with Tucks pads.  Will recommend aloe vera gel and compounded medication.  Patient agrees that she would not like to proceed with the incision of the thrombosed hemorrhoid. She declines the compounded medication. Will try the sitz bath's at home and the aloe vera gel with some lidocaine to see if this helps with her.  I did give her the information for [facility] surgery as well.  She is hemodynamically stable and is stable for discharge home.  We discussed plan at bedside. We discussed strict return precautions and red flag symptoms. The patient verbalized their understanding and agrees to the plan. The patient is stable and being discharged home in good condition.  Portions of  this report may have been transcribed using voice recognition software. Every effort was made to ensure accuracy; however, inadvertent computerized transcription errors may be present.    Final Clinical Impression(s) / ED Diagnoses Final diagnoses:  Thrombosed external hemorrhoid    Rx / DC Orders ED Discharge Orders     None         Spence Dux, Kirby Peoples 02/20/24 1550    Dorenda Gandy, MD 02/25/24 1427

## 2024-02-20 NOTE — ED Triage Notes (Signed)
 PT arrives via POV. Pt reports having a painful hemorrhoid for the past week. States it usually gets better with preparation h, but it has not this time. Denies any bleeding. Her PCP informed her that she would need an I&D of the hemorrhoid. PT AxOx4. Denies any other complaints.

## 2024-02-20 NOTE — Discharge Instructions (Signed)
 You were seen in the emerged part today for evaluation of your hemorrhoid.  I have included information for [place] surgery for you to follow-up with, please call to schedule an appointment. These likely resolve on their own, but sometimes need to have surgery to remove them. I have included more information on sitz bathes into your discharge paperwork for you to review. You can continue using the treatments you are doing. You can also try aloe vera gel with lidocaine as well. If you start to have abdominal pain, trouble having a bowel movement, fever, worsening swelling, please return to the ER. If you have any other concerns, new or worsening symptoms, please return to the ER for re-evaluation.   Contact a doctor if: You have pain and swelling that do not get better with treatment. You have trouble pooping. You cannot poop. You have pain or swelling outside the area of the hemorrhoids. Get help right away if: You have bleeding from the butt that will not stop.

## 2024-02-23 ENCOUNTER — Ambulatory Visit: Admitting: Podiatry

## 2024-02-23 ENCOUNTER — Ambulatory Visit (INDEPENDENT_AMBULATORY_CARE_PROVIDER_SITE_OTHER): Admitting: Podiatry

## 2024-02-23 DIAGNOSIS — M2062 Acquired deformities of toe(s), unspecified, left foot: Secondary | ICD-10-CM

## 2024-02-23 DIAGNOSIS — M2061 Acquired deformities of toe(s), unspecified, right foot: Secondary | ICD-10-CM | POA: Diagnosis not present

## 2024-02-23 DIAGNOSIS — L84 Corns and callosities: Secondary | ICD-10-CM | POA: Diagnosis not present

## 2024-02-23 NOTE — Progress Notes (Signed)
 Subjective: Chief Complaint  Patient presents with   Callouses   Foot Pain    RM 12 Patient is here for bilateral foot pain( and callus. Right Foot- Callus pain between the right hallux and right index toe, and pain on the ball of the right foot. Left Foot- callus between left hallux and left index toe.   73 year old female presents the office today above concerns.  She gets a painful corn between her 1st and 2nd toes on the right foot much worse than the left.  She also gets a skin lesion on the bottom of her left foot submetatarsal which causes discomfort.  She does not report any recent injuries.  No recent treatment other than trying to keep nails that she wears.  No swelling or redness or any drainage.  No other concerns.  Objective: AAO x3, NAD DP/PT pulses palpable bilaterally, CRT less than 3 seconds The hallux, second toes are rubbing resulting in a hyperkeratotic lesion on the lateral aspect of the right hallux, medial aspect of the second digit but the right foot worse than left.  There is no underlying ulceration, drainage or signs of infection.  Hyperkeratotic lesion noted submetatarsal left foot without any ulceration, drainage or signs of there are no open lesions bilateral.  There is directly on the hyperkeratotic lesions but no other areas of discomfort today.  No pain with calf compression, swelling, warmth, erythema  Assessment: Digital hyperkeratotic lesions from rubbing of the toes  Plan: -All treatment options discussed with the patient including all alternatives, risks, complications.  -Sharply debrided the lesions or any complications or bleeding.  Dispensed toe spacers.  Discussed shoes to avoid excess pressure. Offloading will be important.  Discussed that unfortunately this is likely to be a reoccurring issue given the rubbing of the toes.  She consider surgical intervention in the future if needed. -Patient encouraged to call the office with any questions, concerns,  change in symptoms.   No follow-ups on file.  Charity Conch DPM

## 2024-02-23 NOTE — Patient Instructions (Signed)
 For the nails you can use  a low dose BIOTIN supplement and also topical urea nail gel.

## 2024-03-05 DIAGNOSIS — C50011 Malignant neoplasm of nipple and areola, right female breast: Secondary | ICD-10-CM | POA: Diagnosis not present

## 2024-03-05 DIAGNOSIS — Z1231 Encounter for screening mammogram for malignant neoplasm of breast: Secondary | ICD-10-CM | POA: Diagnosis not present

## 2024-03-05 DIAGNOSIS — Z86000 Personal history of in-situ neoplasm of breast: Secondary | ICD-10-CM | POA: Diagnosis not present

## 2024-03-05 DIAGNOSIS — Z9889 Other specified postprocedural states: Secondary | ICD-10-CM | POA: Diagnosis not present

## 2024-03-05 DIAGNOSIS — Z923 Personal history of irradiation: Secondary | ICD-10-CM | POA: Diagnosis not present

## 2024-03-05 DIAGNOSIS — Z08 Encounter for follow-up examination after completed treatment for malignant neoplasm: Secondary | ICD-10-CM | POA: Diagnosis not present

## 2024-03-08 DIAGNOSIS — K2980 Duodenitis without bleeding: Secondary | ICD-10-CM | POA: Diagnosis not present

## 2024-03-08 DIAGNOSIS — K645 Perianal venous thrombosis: Secondary | ICD-10-CM | POA: Diagnosis not present

## 2024-03-08 DIAGNOSIS — R1013 Epigastric pain: Secondary | ICD-10-CM | POA: Diagnosis not present

## 2024-03-08 DIAGNOSIS — R1031 Right lower quadrant pain: Secondary | ICD-10-CM | POA: Diagnosis not present

## 2024-03-22 DIAGNOSIS — L718 Other rosacea: Secondary | ICD-10-CM | POA: Diagnosis not present

## 2024-03-22 DIAGNOSIS — R208 Other disturbances of skin sensation: Secondary | ICD-10-CM | POA: Diagnosis not present

## 2024-04-19 DIAGNOSIS — M1990 Unspecified osteoarthritis, unspecified site: Secondary | ICD-10-CM | POA: Diagnosis not present

## 2024-04-19 DIAGNOSIS — R350 Frequency of micturition: Secondary | ICD-10-CM | POA: Diagnosis not present

## 2024-04-19 DIAGNOSIS — Z029 Encounter for administrative examinations, unspecified: Secondary | ICD-10-CM | POA: Diagnosis not present

## 2024-04-19 DIAGNOSIS — D61818 Other pancytopenia: Secondary | ICD-10-CM | POA: Diagnosis not present

## 2024-04-19 DIAGNOSIS — R252 Cramp and spasm: Secondary | ICD-10-CM | POA: Diagnosis not present

## 2024-04-19 DIAGNOSIS — N3281 Overactive bladder: Secondary | ICD-10-CM | POA: Diagnosis not present

## 2024-04-30 DIAGNOSIS — E063 Autoimmune thyroiditis: Secondary | ICD-10-CM | POA: Diagnosis not present

## 2024-04-30 DIAGNOSIS — Z800 Family history of malignant neoplasm of digestive organs: Secondary | ICD-10-CM | POA: Diagnosis not present

## 2024-04-30 DIAGNOSIS — K648 Other hemorrhoids: Secondary | ICD-10-CM | POA: Diagnosis not present

## 2024-04-30 DIAGNOSIS — K625 Hemorrhage of anus and rectum: Secondary | ICD-10-CM | POA: Diagnosis not present

## 2024-04-30 DIAGNOSIS — L659 Nonscarring hair loss, unspecified: Secondary | ICD-10-CM | POA: Diagnosis not present

## 2024-04-30 DIAGNOSIS — Z87891 Personal history of nicotine dependence: Secondary | ICD-10-CM | POA: Diagnosis not present

## 2024-04-30 DIAGNOSIS — D61818 Other pancytopenia: Secondary | ICD-10-CM | POA: Diagnosis not present

## 2024-04-30 DIAGNOSIS — K2980 Duodenitis without bleeding: Secondary | ICD-10-CM | POA: Diagnosis not present

## 2024-04-30 DIAGNOSIS — K635 Polyp of colon: Secondary | ICD-10-CM | POA: Diagnosis not present

## 2024-04-30 DIAGNOSIS — M26609 Unspecified temporomandibular joint disorder, unspecified side: Secondary | ICD-10-CM | POA: Diagnosis not present

## 2024-04-30 DIAGNOSIS — Z1211 Encounter for screening for malignant neoplasm of colon: Secondary | ICD-10-CM | POA: Diagnosis not present

## 2024-04-30 DIAGNOSIS — K644 Residual hemorrhoidal skin tags: Secondary | ICD-10-CM | POA: Diagnosis not present

## 2024-04-30 DIAGNOSIS — D124 Benign neoplasm of descending colon: Secondary | ICD-10-CM | POA: Diagnosis not present

## 2024-04-30 DIAGNOSIS — K5730 Diverticulosis of large intestine without perforation or abscess without bleeding: Secondary | ICD-10-CM | POA: Diagnosis not present

## 2024-04-30 DIAGNOSIS — Z79899 Other long term (current) drug therapy: Secondary | ICD-10-CM | POA: Diagnosis not present

## 2024-05-16 DIAGNOSIS — H04123 Dry eye syndrome of bilateral lacrimal glands: Secondary | ICD-10-CM | POA: Diagnosis not present

## 2024-05-16 DIAGNOSIS — Z961 Presence of intraocular lens: Secondary | ICD-10-CM | POA: Diagnosis not present

## 2024-05-16 DIAGNOSIS — H01001 Unspecified blepharitis right upper eyelid: Secondary | ICD-10-CM | POA: Diagnosis not present

## 2024-05-16 DIAGNOSIS — H52203 Unspecified astigmatism, bilateral: Secondary | ICD-10-CM | POA: Diagnosis not present

## 2024-05-16 DIAGNOSIS — H01004 Unspecified blepharitis left upper eyelid: Secondary | ICD-10-CM | POA: Diagnosis not present

## 2024-06-13 DIAGNOSIS — Z860101 Personal history of adenomatous and serrated colon polyps: Secondary | ICD-10-CM | POA: Diagnosis not present

## 2024-06-13 DIAGNOSIS — K2930 Chronic superficial gastritis without bleeding: Secondary | ICD-10-CM | POA: Diagnosis not present

## 2024-06-13 DIAGNOSIS — R1013 Epigastric pain: Secondary | ICD-10-CM | POA: Diagnosis not present

## 2024-06-13 DIAGNOSIS — K5790 Diverticulosis of intestine, part unspecified, without perforation or abscess without bleeding: Secondary | ICD-10-CM | POA: Diagnosis not present

## 2024-06-13 DIAGNOSIS — K649 Unspecified hemorrhoids: Secondary | ICD-10-CM | POA: Diagnosis not present

## 2024-06-13 DIAGNOSIS — K581 Irritable bowel syndrome with constipation: Secondary | ICD-10-CM | POA: Diagnosis not present

## 2024-06-13 DIAGNOSIS — K2980 Duodenitis without bleeding: Secondary | ICD-10-CM | POA: Diagnosis not present

## 2024-06-13 DIAGNOSIS — K5909 Other constipation: Secondary | ICD-10-CM | POA: Diagnosis not present

## 2024-06-13 DIAGNOSIS — R140 Abdominal distension (gaseous): Secondary | ICD-10-CM | POA: Diagnosis not present

## 2024-06-15 DIAGNOSIS — R252 Cramp and spasm: Secondary | ICD-10-CM | POA: Diagnosis not present

## 2024-06-15 DIAGNOSIS — D61818 Other pancytopenia: Secondary | ICD-10-CM | POA: Diagnosis not present

## 2024-06-15 DIAGNOSIS — R718 Other abnormality of red blood cells: Secondary | ICD-10-CM | POA: Diagnosis not present

## 2024-08-15 DIAGNOSIS — Z23 Encounter for immunization: Secondary | ICD-10-CM | POA: Diagnosis not present
